# Patient Record
Sex: Male | Born: 1940 | Race: White | Hispanic: No | Marital: Married | Smoking: Never smoker
Health system: Southern US, Community
[De-identification: ages and names within clinical notes are randomized; demographics above are authoritative.]

## PROBLEM LIST (undated history)

## (undated) DIAGNOSIS — H269 Unspecified cataract: Secondary | ICD-10-CM

## (undated) DIAGNOSIS — K5792 Diverticulitis of intestine, part unspecified, without perforation or abscess without bleeding: Secondary | ICD-10-CM

## (undated) DIAGNOSIS — I82409 Acute embolism and thrombosis of unspecified deep veins of unspecified lower extremity: Secondary | ICD-10-CM

## (undated) DIAGNOSIS — I10 Essential (primary) hypertension: Secondary | ICD-10-CM

## (undated) DIAGNOSIS — T884XXA Failed or difficult intubation, initial encounter: Secondary | ICD-10-CM

## (undated) DIAGNOSIS — T8859XA Other complications of anesthesia, initial encounter: Secondary | ICD-10-CM

## (undated) DIAGNOSIS — Z87442 Personal history of urinary calculi: Secondary | ICD-10-CM

## (undated) DIAGNOSIS — Z95828 Presence of other vascular implants and grafts: Secondary | ICD-10-CM

## (undated) DIAGNOSIS — K5790 Diverticulosis of intestine, part unspecified, without perforation or abscess without bleeding: Secondary | ICD-10-CM

## (undated) HISTORY — DX: Diverticulitis of intestine, part unspecified, without perforation or abscess without bleeding: K57.92

## (undated) HISTORY — DX: Diverticulosis of intestine, part unspecified, without perforation or abscess without bleeding: K57.90

## (undated) HISTORY — DX: Essential (primary) hypertension: I10

## (undated) HISTORY — DX: Presence of other vascular implants and grafts: Z95.828

## (undated) HISTORY — PX: COLONOSCOPY: SHX174

## (undated) HISTORY — DX: Unspecified cataract: H26.9

## (undated) HISTORY — DX: Acute embolism and thrombosis of unspecified deep veins of unspecified lower extremity: I82.409

## (undated) HISTORY — PX: CATARACT EXTRACTION: SUR2

## (undated) HISTORY — PX: POLYPECTOMY: SHX149

---

## 1983-11-22 HISTORY — PX: HERNIA REPAIR: SHX51

## 1999-11-22 HISTORY — PX: KNEE SURGERY: SHX244

## 2003-11-22 HISTORY — PX: CHOLECYSTECTOMY: SHX55

## 2004-06-21 DIAGNOSIS — Z95828 Presence of other vascular implants and grafts: Secondary | ICD-10-CM

## 2004-06-21 HISTORY — DX: Presence of other vascular implants and grafts: Z95.828

## 2015-04-06 ENCOUNTER — Encounter: Payer: Self-pay | Admitting: Gastroenterology

## 2015-04-10 ENCOUNTER — Encounter: Payer: Self-pay | Admitting: Gastroenterology

## 2015-04-10 ENCOUNTER — Ambulatory Visit (INDEPENDENT_AMBULATORY_CARE_PROVIDER_SITE_OTHER): Payer: Medicare Other | Admitting: Gastroenterology

## 2015-04-10 ENCOUNTER — Other Ambulatory Visit (INDEPENDENT_AMBULATORY_CARE_PROVIDER_SITE_OTHER): Payer: Medicare Other

## 2015-04-10 VITALS — BP 140/74 | HR 66 | Ht 68.75 in | Wt 272.0 lb

## 2015-04-10 DIAGNOSIS — R109 Unspecified abdominal pain: Secondary | ICD-10-CM | POA: Diagnosis not present

## 2015-04-10 LAB — CBC WITH DIFFERENTIAL/PLATELET
Basophils Absolute: 0.1 10*3/uL (ref 0.0–0.1)
Basophils Relative: 0.8 % (ref 0.0–3.0)
Eosinophils Absolute: 0.3 10*3/uL (ref 0.0–0.7)
Eosinophils Relative: 4.2 % (ref 0.0–5.0)
HCT: 39.2 % (ref 39.0–52.0)
Hemoglobin: 13.4 g/dL (ref 13.0–17.0)
Lymphocytes Relative: 21.8 % (ref 12.0–46.0)
Lymphs Abs: 1.6 10*3/uL (ref 0.7–4.0)
MCHC: 34.3 g/dL (ref 30.0–36.0)
MCV: 95.9 fl (ref 78.0–100.0)
Monocytes Absolute: 0.7 10*3/uL (ref 0.1–1.0)
Monocytes Relative: 9.4 % (ref 3.0–12.0)
Neutro Abs: 4.7 10*3/uL (ref 1.4–7.7)
Neutrophils Relative %: 63.8 % (ref 43.0–77.0)
Platelets: 165 10*3/uL (ref 150.0–400.0)
RBC: 4.09 Mil/uL — ABNORMAL LOW (ref 4.22–5.81)
RDW: 13.8 % (ref 11.5–15.5)
WBC: 7.4 10*3/uL (ref 4.0–10.5)

## 2015-04-10 LAB — PROTIME-INR
INR: 2 ratio — ABNORMAL HIGH (ref 0.8–1.0)
Prothrombin Time: 21.9 s — ABNORMAL HIGH (ref 9.6–13.1)

## 2015-04-10 LAB — COMPREHENSIVE METABOLIC PANEL
ALT: 26 U/L (ref 0–53)
AST: 43 U/L — ABNORMAL HIGH (ref 0–37)
Albumin: 4.2 g/dL (ref 3.5–5.2)
Alkaline Phosphatase: 62 U/L (ref 39–117)
BUN: 13 mg/dL (ref 6–23)
CO2: 32 mEq/L (ref 19–32)
Calcium: 9.4 mg/dL (ref 8.4–10.5)
Chloride: 105 mEq/L (ref 96–112)
Creatinine, Ser: 1.01 mg/dL (ref 0.40–1.50)
GFR: 76.79 mL/min (ref >60.00)
Glucose, Bld: 124 mg/dL — ABNORMAL HIGH (ref 70–99)
Potassium: 4.2 mEq/L (ref 3.5–5.1)
Sodium: 141 mEq/L (ref 135–145)
Total Bilirubin: 0.6 mg/dL (ref 0.2–1.2)
Total Protein: 7.3 g/dL (ref 6.0–8.3)

## 2015-04-10 MED ORDER — CIPROFLOXACIN HCL 500 MG PO TABS: 500.0000 mg | ORAL_TABLET | Freq: Two times a day (BID) | ORAL | Status: DC

## 2015-04-10 NOTE — Progress Notes (Signed)
HPI: This is a  very pleasant 74 year old man    who was referred to me by Dr. Lars Pinks to evaluate  abdominal pain .    Chief complaint is left-sided abdominal pain  Colonoscopy 2008 Dr. West Carbo, no polyps, but + diverticulosis.    He had lower abd pains 2010, had CT scan, which showed "extensive diverticulitis" per patient.  Took cipro/flagyl.  Had another bout 2012, with abd pains, presumed diverticulitits, treated with abx empiricially  Has been treated with cipro/flagyl for 10 days and symptoms have always improved.  Pains interimittently in LLQ pain for past month, no fevers or chills. Mild nausea.  Just an ache which it hits.  His bowels have been a bit looser than usual, a bit off color as well (yellow).  He restarted cipro/flagyl 4 days ago, not as much improvement.  Overall stable weight.    Review of systems: Pertinent positive and negative review of systems were noted in the above HPI section. Complete review of systems was performed and was otherwise normal.   Past Medical History  Diagnosis Date  . Diverticulosis   . Diverticulitis   . DVT (deep venous thrombosis)   . Hypertension   . Kidney stones     Past Surgical History  Procedure Laterality Date  . Cholecystectomy  2005  . Hernia repair  1985  . Knee surgery  2001    Current Outpatient Prescriptions  Medication Sig Dispense Refill  . allopurinol (ZYLOPRIM) 300 MG tablet Take 300 mg by mouth daily.    . ciprofloxacin (CIPRO) 500 MG tablet Take 500 mg by mouth 2 (two) times daily.    Marland Kitchen labetalol (NORMODYNE) 200 MG tablet Take 200 mg by mouth 2 (two) times daily.    . metroNIDAZOLE (FLAGYL) 500 MG tablet Take 500 mg by mouth 3 (three) times daily.    Marland Kitchen warfarin (COUMADIN) 5 MG tablet Take 5 mg by mouth daily. 1 1/2 tablet daily alternating with 1 tablet daily     No current facility-administered medications for this visit.    Allergies as of 04/10/2015  . (No Known Allergies)    Family History   Problem Relation Age of Onset  . Stomach cancer      History   Social History  . Marital Status: Married    Spouse Name: N/A  . Number of Children: 2  . Years of Education: N/A   Occupational History  . Retired    Social History Main Topics  . Smoking status: Never Smoker   . Smokeless tobacco: Never Used  . Alcohol Use: No  . Drug Use: No  . Sexual Activity: Not on file   Other Topics Concern  . Not on file   Social History Narrative  . No narrative on file     Physical Exam: BP 146/78 mmHg  Pulse 66  Ht 5' 8.75" (1.746 m)  Wt 272 lb (123.378 kg)  BMI 40.47 kg/m2 Constitutional: generally well-appearing, obese Psychiatric: alert and oriented x3 Eyes: extraocular movements intact Mouth: oral pharynx moist, no lesions Neck: supple no lymphadenopathy Cardiovascular: heart regular rate and rhythm Lungs: clear to auscultation bilaterally Abdomen: soft, mildly tender left lower quadrant, nondistended, no obvious ascites, no peritoneal signs, normal bowel sounds Extremities: no lower extremity edema bilaterally Skin: no lesions on visible extremities   Assessment and plan: 74 y.o. male with  history of diverticulitis, now with left-sided abdominal pains  Seems most likely that this is recurrent diverticulitis. I would like to get records  from his previous work up in [place] including colonoscopy and CAT scans. He will have a set of blood work today including a CBC, complete metabolic profile and coags done today. I recommended he continue his Cipro and Flagyl for a total of 10 days. We will arrange for repeat CT scan abdomen and pelvis with IV and oral contrast.   [name], MD [facility] 04/10/2015, 2:35 PM  Cc: Lars Pinks, MD

## 2015-04-10 NOTE — Patient Instructions (Addendum)
We will get records sent from your previous gastroenterologist for review.  This will include any endoscopic (colonoscopy or upper endoscopy) procedures and any associated pathology reports.  Also want CT scan results from 2008. Will call in refills of cipro (570m pill, one pill twice daily). Continue cipro and flagyl for total 10 days course. You will have labs checked today in the basement lab.  Please head down after you check out with the front desk  (cbc, cmet, inr). You will be set up for a CT scan of abdomen and pelvis with IV and oral contrast. You have been scheduled [id number]---this is in the same building as [facility].   You are scheduled on 04/17/15 at 10 am. You should arrive 15 minutes prior to your appointment time for registration. Please follow the written instructions below on the day of your exam:  WARNING: IF YOU ARE ALLERGIC TO IODINE/X-RAY DYE, PLEASE NOTIFY RADIOLOGY IMMEDIATELY AT [phone] YOU WILL BE GIVEN A 13 HOUR PREMEDICATION PREP.  1) Do not eat or drink anything after 6 am (4 hours prior to your test) 2) You have been given 2 bottles of oral contrast to drink. The solution may taste better if refrigerated, but do NOT add ice or any other liquid to this solution. Shake well before drinking.    Drink 1 bottle of contrast @ 8 am (2 hours prior to your exam)  Drink 1 bottle of contrast @ 9 am (1 hour prior to your exam)  You may take any medications as prescribed with a small amount of water except for the following: Metformin, Glucophage, Glucovance, Avandamet, Riomet, Fortamet, Actoplus Met, Janumet, Glumetza or Metaglip. The above medications must be held the day of the exam AND 48 hours after the exam.  The purpose of you drinking the oral contrast is to aid in the visualization of your intestinal tract. The contrast solution may cause some diarrhea. Before your exam is started,  you will be given a small amount of fluid to drink. Depending on your individual set of symptoms, you may also receive an intravenous injection of x-ray contrast/dye. [facility], depending on the type of exam you are having performed.  This test typically takes 30-45 minutes to complete.  If you have any questions regarding your exam or if you need to reschedule, you may call the CT department at [phone] the hours of 8:00 am and 5:00 pm, Monday-Friday.  ________________________________________________________________________

## 2015-04-13 ENCOUNTER — Telehealth: Payer: Self-pay | Admitting: Gastroenterology

## 2015-04-14 NOTE — Telephone Encounter (Signed)
Please call the patient. WBC was normal (less likely diverticulitis). Still awaiting records from previous GI. Await upcoming CT scan report as well. Should continue with the suggestions outlined at recent visit.

## 2015-04-14 NOTE — Telephone Encounter (Signed)
Pt aware we will call when the CT results are back.

## 2015-04-17 ENCOUNTER — Ambulatory Visit (INDEPENDENT_AMBULATORY_CARE_PROVIDER_SITE_OTHER)
Admission: RE | Admit: 2015-04-17 | Discharge: 2015-04-17 | Disposition: A | Discharge status: Home / Self Care | Payer: Medicare Other | Source: Ambulatory Visit | Attending: Gastroenterology | Admitting: Gastroenterology

## 2015-04-17 ENCOUNTER — Telehealth: Payer: Self-pay | Admitting: Gastroenterology

## 2015-04-17 DIAGNOSIS — R109 Unspecified abdominal pain: Secondary | ICD-10-CM

## 2015-04-17 MED ORDER — IOHEXOL 300 MG/ML  SOLN
100.0000 mL | Freq: Once | INTRAMUSCULAR | Status: AC | PRN
  Administered 2015-04-17: 100 mL via INTRAVENOUS

## 2015-04-17 NOTE — Telephone Encounter (Signed)
[place] [facility] [place] [facility]: "extensive diverticulosis, no evidence of polyps" also "less than ideal prep"  Awaiting CT scan report

## 2015-04-21 ENCOUNTER — Telehealth: Payer: Self-pay | Admitting: Gastroenterology

## 2015-04-22 NOTE — Telephone Encounter (Signed)
Please call the patient. Scan shows diverticulosis, ? Mild residual diverticulitis as well. ALSO scan shows cirrhosis. This is new information for him. The liver appears scarred, but by all lab tests his liver is functioning very well. His last colonoscopy was 9 years ago and the prep was "less than idea" and so I think that should be repeated in 3-4 weeks from now. He is on coumadin for DVT, needs to be held for 5 days (need OK from PCP) about that.  Thanks

## 2015-04-24 NOTE — Telephone Encounter (Signed)
Pt has been scheduled for 06/01/15 for colon and 05/27/15 for previsit and coumadin clearance letter has been sent to Dr Lars Pinks.

## 2015-05-07 ENCOUNTER — Telehealth: Payer: Self-pay | Admitting: Gastroenterology

## 2015-05-07 NOTE — Telephone Encounter (Signed)
I would not repeat Abx for now, he is already scheduled for colonsocoyp in early/mid July and should keep that appt. If pains or diarhea significantly worsens he needs to call before then.  thanks

## 2015-05-07 NOTE — Telephone Encounter (Signed)
Pt notified of the recommendations and will keep colon as scheduled.  He will call if needed prior to that appt

## 2015-05-07 NOTE — Telephone Encounter (Signed)
Pt finished the cipro and flagyl course. He still has loose stools and some discomfort.  He would like to know if he should take another course of abx or wait and see how he does?

## 2015-05-26 ENCOUNTER — Telehealth: Payer: Self-pay

## 2015-05-26 NOTE — Telephone Encounter (Signed)
-----   Message from Barron Alvine, [place] sent at 04/24/2015 11:01 AM EDT ----- Waiting for anti coag from Dr Lars Pinks for coumadin clearance

## 2015-05-27 ENCOUNTER — Telehealth: Payer: Self-pay

## 2015-05-27 ENCOUNTER — Ambulatory Visit (AMBULATORY_SURGERY_CENTER): Payer: Self-pay

## 2015-05-27 VITALS — Ht 69.0 in | Wt 264.4 lb

## 2015-05-27 DIAGNOSIS — R109 Unspecified abdominal pain: Secondary | ICD-10-CM

## 2015-05-27 MED ORDER — MOVIPREP 100 G PO SOLR: 1.0000 | Freq: Once | ORAL | Status: DC

## 2015-05-27 NOTE — Telephone Encounter (Signed)
Still no response from V Falgui concerning coumadin.  Pt is scheduled to be in previsit at 8:30am this am.  Are you OK with [place] going ahead and holding Coumadin for 5 days?  Previsit, Levada Dy

## 2015-05-27 NOTE — Telephone Encounter (Signed)
No, I prefer input from his PCP about the coumadin.    Patty, can you please try his PCP office again.  If we don't hear from their office today we will have to postpone his procedure.

## 2015-05-27 NOTE — Telephone Encounter (Signed)
See alternate note  

## 2015-05-27 NOTE — Telephone Encounter (Signed)
Ok, thanks.

## 2015-05-27 NOTE — Telephone Encounter (Signed)
Has been told has a "kink" in airway by anesthesiologist but has been intubated since then while awake.  Has had previsit on 05/27/15.  Still OK for [facility]?  Levada Dy

## 2015-05-27 NOTE — Telephone Encounter (Signed)
Pt says he was called by V Falgui's office.  He hasnt had Coumadin since Monday 05/25/15.  He was told to stop for one week before procedure.  Thank you, Levada Dy

## 2015-05-27 NOTE — Progress Notes (Signed)
Has a "kink" in airway (was told by anesthesiologist but has been intubated since then)  No allergies to eggs or soy No home oxygen No diet/weight loss meds  No computer

## 2015-05-28 ENCOUNTER — Telehealth: Payer: Self-pay | Admitting: Gastroenterology

## 2015-05-28 NOTE — Telephone Encounter (Signed)
Austin Guzman,  He needs to be scheduled at the hospital  Thanks,  Jenny Reichmann

## 2015-05-28 NOTE — Telephone Encounter (Signed)
Pharmacy was called and the Movi prep is $110 pt has been notified

## 2015-05-29 ENCOUNTER — Telehealth: Payer: Self-pay | Admitting: Gastroenterology

## 2015-05-29 ENCOUNTER — Other Ambulatory Visit: Payer: Self-pay

## 2015-05-29 DIAGNOSIS — K5731 Diverticulosis of large intestine without perforation or abscess with bleeding: Secondary | ICD-10-CM

## 2015-05-29 NOTE — Telephone Encounter (Signed)
Dr Jacobs please advise  

## 2015-05-29 NOTE — Telephone Encounter (Signed)
Per Osvaldo Angst CRNA, pt needs to have procedure at hospital for airway issues (see previous note) pt was scheduled for colonoscopy at [facility] on Monday 06/01/15 at 2:30 pm for diverticulosis-thanks adm  Patty- pt is on coumadin, this looks like it might need to be readdressed.

## 2015-05-29 NOTE — Telephone Encounter (Signed)
Pt called to confirm colon instructions

## 2015-05-29 NOTE — Telephone Encounter (Signed)
Ok, need to change this to being done at [facility] with MAC sedation, next Thursday (14th).  Please cancel the [place] case.   Thanks

## 2015-05-29 NOTE — Telephone Encounter (Signed)
Sorry I closed this by accident

## 2015-05-29 NOTE — Telephone Encounter (Signed)
Pt has been re scheduled to [facility] 06/04/15 130 pm, he was re instructed and will call with any further questions or concerns.  He was advised to remain off of coumadin per Dr Ardis Hughs

## 2015-05-29 NOTE — Telephone Encounter (Signed)
Per Dr Ardis Hughs pt can remain off of plavix until after the procedure

## 2015-06-01 ENCOUNTER — Encounter: Payer: Medicare Other | Admitting: Gastroenterology

## 2015-06-01 ENCOUNTER — Encounter (HOSPITAL_COMMUNITY): Payer: Self-pay | Admitting: *Deleted

## 2015-06-03 ENCOUNTER — Ambulatory Visit: Payer: Self-pay | Admitting: Gastroenterology

## 2015-06-04 ENCOUNTER — Ambulatory Visit (HOSPITAL_COMMUNITY)
Admission: RE | Admit: 2015-06-04 | Discharge: 2015-06-04 | Disposition: A | Discharge status: Home / Self Care | Payer: Medicare Other | Source: Ambulatory Visit | Attending: Gastroenterology | Admitting: Gastroenterology

## 2015-06-04 ENCOUNTER — Encounter (HOSPITAL_COMMUNITY)
Admission: RE | Disposition: A | Discharge status: Home / Self Care | Payer: Self-pay | Source: Ambulatory Visit | Attending: Gastroenterology

## 2015-06-04 DIAGNOSIS — I10 Essential (primary) hypertension: Secondary | ICD-10-CM | POA: Insufficient documentation

## 2015-06-04 DIAGNOSIS — R972 Elevated prostate specific antigen [PSA]: Secondary | ICD-10-CM | POA: Insufficient documentation

## 2015-06-04 DIAGNOSIS — Z7901 Long term (current) use of anticoagulants: Secondary | ICD-10-CM | POA: Diagnosis not present

## 2015-06-04 DIAGNOSIS — Z538 Procedure and treatment not carried out for other reasons: Secondary | ICD-10-CM | POA: Diagnosis not present

## 2015-06-04 DIAGNOSIS — Z1211 Encounter for screening for malignant neoplasm of colon: Secondary | ICD-10-CM | POA: Insufficient documentation

## 2015-06-04 DIAGNOSIS — Z79899 Other long term (current) drug therapy: Secondary | ICD-10-CM | POA: Diagnosis not present

## 2015-06-04 DIAGNOSIS — E785 Hyperlipidemia, unspecified: Secondary | ICD-10-CM | POA: Insufficient documentation

## 2015-06-04 DIAGNOSIS — Z86718 Personal history of other venous thrombosis and embolism: Secondary | ICD-10-CM | POA: Insufficient documentation

## 2015-06-04 DIAGNOSIS — M109 Gout, unspecified: Secondary | ICD-10-CM | POA: Insufficient documentation

## 2015-06-04 HISTORY — DX: Personal history of urinary calculi: Z87.442

## 2015-06-04 SURGERY — CANCELLED PROCEDURE

## 2015-06-04 NOTE — H&P (Signed)
  He dranak first 1/2 of Movi prep last night without trouble. Early this AM he awoke unable to manage his secretions, full feeling in throat.  He came to [facility] admitting and I met him in endo. On exam his tongue was too large to get a good view of his throat but his voice was gurgly.  He is breathing fine.    I think he is probably having a reaction to the prep, this is quite unusual however. Case cancelled for today and I called his ENT office and they were very helpful, able to see him this morning on urgent basis.  We will contact him about rescheduling the procedure, probably with non PEG prep.  I did quick review, PEG preps rarely cause anaphylaxis.

## 2015-06-04 NOTE — Progress Notes (Signed)
Pt showed up early for colonoscopy today because he woke up and his throat was "almost swollen shut," when he woke up this morning. Pt states that he can still swallow but it is difficult, he is not having any difficulty breathing and does not present with a rash or fever. Pt denies pain in his throat, just swelling. Dr. Ardis Hughs to evaluate. Brt, rn

## 2015-08-10 ENCOUNTER — Telehealth: Payer: Self-pay | Admitting: Gastroenterology

## 2015-08-10 NOTE — Telephone Encounter (Signed)
Spoke with the patient. He is having some off and on pain to the left of his abdomen. No changes in his bowel movements, but he is having increased gas. No blood in bowel movements. He states he has not had diverticulitis since 2011, but he "remembers how it felt." His symptoms have been coming on for a week. There are no openings on the APP schedule this week. Please advise.

## 2015-08-11 ENCOUNTER — Other Ambulatory Visit (INDEPENDENT_AMBULATORY_CARE_PROVIDER_SITE_OTHER): Payer: Medicare Other

## 2015-08-11 ENCOUNTER — Other Ambulatory Visit: Payer: Self-pay

## 2015-08-11 DIAGNOSIS — R1032 Left lower quadrant pain: Secondary | ICD-10-CM

## 2015-08-11 LAB — CBC WITH DIFFERENTIAL/PLATELET
Basophils Absolute: 0.1 10*3/uL (ref 0.0–0.1)
Basophils Relative: 0.7 % (ref 0.0–3.0)
Eosinophils Absolute: 0.3 10*3/uL (ref 0.0–0.7)
Eosinophils Relative: 3.1 % (ref 0.0–5.0)
HCT: 42.2 % (ref 39.0–52.0)
Hemoglobin: 14.1 g/dL (ref 13.0–17.0)
Lymphocytes Relative: 22.9 % (ref 12.0–46.0)
Lymphs Abs: 2.3 10*3/uL (ref 0.7–4.0)
MCHC: 33.5 g/dL (ref 30.0–36.0)
MCV: 98.5 fl (ref 78.0–100.0)
Monocytes Absolute: 1.2 10*3/uL — ABNORMAL HIGH (ref 0.1–1.0)
Monocytes Relative: 11.4 % (ref 3.0–12.0)
Neutro Abs: 6.3 10*3/uL (ref 1.4–7.7)
Neutrophils Relative %: 61.9 % (ref 43.0–77.0)
Platelets: 167 10*3/uL (ref 150.0–400.0)
RBC: 4.28 Mil/uL (ref 4.22–5.81)
RDW: 14.3 % (ref 11.5–15.5)
WBC: 10.2 10*3/uL (ref 4.0–10.5)

## 2015-08-11 LAB — COMPREHENSIVE METABOLIC PANEL
ALT: 25 U/L (ref 0–53)
AST: 30 U/L (ref 0–37)
Albumin: 4.2 g/dL (ref 3.5–5.2)
Alkaline Phosphatase: 65 U/L (ref 39–117)
BUN: 15 mg/dL (ref 6–23)
CO2: 29 mEq/L (ref 19–32)
Calcium: 9.6 mg/dL (ref 8.4–10.5)
Chloride: 104 mEq/L (ref 96–112)
Creatinine, Ser: 0.96 mg/dL (ref 0.40–1.50)
GFR: 81.35 mL/min (ref >60.00)
Glucose, Bld: 105 mg/dL — ABNORMAL HIGH (ref 70–99)
Potassium: 4.3 mEq/L (ref 3.5–5.1)
Sodium: 141 mEq/L (ref 135–145)
Total Bilirubin: 0.7 mg/dL (ref 0.2–1.2)
Total Protein: 7.6 g/dL (ref 6.0–8.3)

## 2015-08-11 NOTE — Telephone Encounter (Signed)
Patient in for labs today. Appointment here 08/14/15. If he acutely worsens he will call or go to the ER

## 2015-08-11 NOTE — Telephone Encounter (Signed)
Looks like there is a 2:30 opening this Friday on my schedule. Please off him that appt.  Would like him to have CBC, cmet the day prior as well.  Thanks

## 2015-08-11 NOTE — Telephone Encounter (Signed)
You have 8 patients on Friday afternoon. Is it okay to add him on? The 2:30 pm has been taken. I will get him in for labs.

## 2015-08-11 NOTE — Telephone Encounter (Signed)
Darn that was taken quickly.  Yes OK to add him on double book for the morning.  thanks

## 2015-08-12 ENCOUNTER — Telehealth: Payer: Self-pay | Admitting: Gastroenterology

## 2015-08-12 NOTE — Telephone Encounter (Signed)
I am sorry, it has been filled.

## 2015-08-12 NOTE — Telephone Encounter (Signed)
Looks like I have an opening at 1:30 now on that same Friday. Can you please move him to that spot.

## 2015-08-14 ENCOUNTER — Telehealth: Payer: Self-pay | Admitting: *Deleted

## 2015-08-14 ENCOUNTER — Ambulatory Visit (INDEPENDENT_AMBULATORY_CARE_PROVIDER_SITE_OTHER): Payer: Medicare Other | Admitting: Gastroenterology

## 2015-08-14 ENCOUNTER — Encounter: Payer: Self-pay | Admitting: Gastroenterology

## 2015-08-14 VITALS — BP 132/80 | HR 76 | Ht 70.0 in | Wt 261.0 lb

## 2015-08-14 DIAGNOSIS — R109 Unspecified abdominal pain: Secondary | ICD-10-CM

## 2015-08-14 NOTE — Progress Notes (Signed)
[facility] [facility] [place] [facility] [facility]: "extensive diverticulosis, no evidence of polyps" also "less than ideal prep"  CT scan abd/pelvis with iv and oral contrast 03/2015 1. Severe diverticulosis involving the descending colon and sigmoid colon. Suspect resolving diverticulitis involving the upper sigmoid colon with some residual wall thickening and mild inflammation. No diverticular abscess or free air. 2. CT findings consistent with cirrhosis as detailed above. No ascites. 3. Renal scarring changes, renal cysts and left renal calculus. 4. Status post cholecystectomy with mild intra and extrahepatic biliary dilatation.\\  I last saw him about 2 months ago at the time of a colonoscopy July 2016. He was having an allergic type reaction to his bowel prep. He was having upper throat swelling. The procedure was canceled and he was seen that day by his ear nose and throat physician.    He called earlier this week with LLQ pain; cbc, cmet were both normal and he was set for this appt.   HPI: This is a    very pleasant 74 year old man whom I last saw 2 months ago. He is here with his wife today.  Chief complaint is left lower quadrant pain He continues to have mild left lower quadrant pain. No fevers or chills. The pain is an ache. Does not seem to be too severe. No associated bowel changes. He does not see blood in his stool. He had CBC and see met earlier this week and they're both normal.    Past Medical History  Diagnosis Date  . Diverticulosis   . Diverticulitis   . DVT (deep venous thrombosis)     "green field filter" right thigh   . Hypertension   . History of kidney stones     "lithotripsies"    Past Surgical History  Procedure Laterality Date  . Cholecystectomy  2005  . Knee surgery  2001    left knee scope  . Colonoscopy    . Hernia repair  1985    inguinal  . Cataract extraction Right     Current Outpatient Prescriptions  Medication Sig Dispense Refill  . allopurinol  (ZYLOPRIM) 300 MG tablet Take 300 mg by mouth daily.    Marland Kitchen labetalol (NORMODYNE) 200 MG tablet Take 200 mg by mouth 2 (two) times daily.    Marland Kitchen warfarin (COUMADIN) 5 MG tablet Take 5-7.5 mg by mouth daily. 1 1/2 tablet daily alternating with 1 tablet daily     No current facility-administered medications for this visit.    Allergies as of 08/14/2015 - Review Complete 08/14/2015  Allergen Reaction Noted  . Demerol [meperidine] Nausea And Vomiting 05/27/2015    Family History  Problem Relation Age of Onset  . Stomach cancer    . Colon cancer Neg Hx   . Colon polyps Neg Hx     Social History   Social History  . Marital Status: Married    Spouse Name: N/A  . Number of Children: 2  . Years of Education: N/A   Occupational History  . Retired    Social History Main Topics  . Smoking status: Never Smoker   . Smokeless tobacco: Never Used  . Alcohol Use: No  . Drug Use: No  . Sexual Activity: Not on file   Other Topics Concern  . Not on file   Social History Narrative     Physical Exam: BP 132/80 mmHg  Pulse 76  Ht _0  (1.778 m)  Wt 261 lb (118.389 kg)  BMI 37.45 kg/m2 Constitutional: generally well-appearing Psychiatric:  alert and oriented x3 Abdomen: soft, nontender, nondistended, no obvious ascites, no peritoneal signs, normal bowel sounds   Assessment and plan: 74 y.o. male with left lower quadrant pain, abnormal sigmoid on May 2016 CT, allergic type reaction to MoviPrep   I do not think he currently has acute diverticulitis. He has had abnormal sigmoid colon in his last colonoscopy was 9 years ago and the prep was described as "less than perfect". I recommended that we try to repeat prep him for colonoscopy. He really seemed to have an allergic reaction to MoviPrep and so this time I will go with a bottle of mag citrate with Fleet enemas on either side. We will do this form across the street because he has had tracheal problems in the past. He will hold his  Coumadin 5 days as he was authorized to do before by his primary care physician.   [name], MD [facility] 08/14/2015, 2:46 PM

## 2015-08-14 NOTE — Patient Instructions (Addendum)
You will be set up for a colonoscopy at [facility] with MAC sedation for left sided abdominal pains, abnormal CT scan. Prep will be 2 days of clears, 1 fleet enema the night before the colonoscopy followed by 1 bottle of mag citrate.  Early the morning of the colonoscopy you will complete another fleet enema. You need to hold your coumadin for 5 days prior to the colonoscopy (Dr. Lars Pinks agreed to holding your coumadin previously).

## 2015-08-14 NOTE — Telephone Encounter (Signed)
Letter faxed over    08/14/2015   RE: Quinton Voth DOB: 23-Nov-1940 MRN: [id number]   Dear Dr. Lars Pinks,    We have scheduled the above patient for an endoscopic procedure. Our records show that he is on anticoagulation therapy.   Please advise as to how long the patient may come off his therapy of coumadin prior to the procedure, which is scheduled for 08-20-15.  Please fax back/ or route the completed form to casie at [phone].   Sincerely,    Gerlean Ren

## 2015-08-17 ENCOUNTER — Encounter (HOSPITAL_COMMUNITY): Payer: Self-pay | Admitting: *Deleted

## 2015-08-17 NOTE — Telephone Encounter (Signed)
Dr. Lars Pinks office faxed over clearance letter ok to hold coumadin. Spoke to patient and he already started holding his coumadin on Saturday. Clearance letter scanned into EPIC.

## 2015-08-17 NOTE — Telephone Encounter (Signed)
Called over to Dr. Lars Pinks office and nurse to call back.

## 2015-08-20 ENCOUNTER — Ambulatory Visit (HOSPITAL_COMMUNITY): Payer: Medicare Other | Admitting: Anesthesiology

## 2015-08-20 ENCOUNTER — Ambulatory Visit (HOSPITAL_COMMUNITY)
Admission: RE | Admit: 2015-08-20 | Discharge: 2015-08-20 | Disposition: A | Discharge status: Home / Self Care | Payer: Medicare Other | Source: Ambulatory Visit | Attending: Gastroenterology | Admitting: Gastroenterology

## 2015-08-20 ENCOUNTER — Telehealth: Payer: Self-pay

## 2015-08-20 ENCOUNTER — Encounter (HOSPITAL_COMMUNITY)
Admission: RE | Disposition: A | Discharge status: Home / Self Care | Payer: Self-pay | Source: Ambulatory Visit | Attending: Gastroenterology

## 2015-08-20 ENCOUNTER — Encounter (HOSPITAL_COMMUNITY): Payer: Self-pay | Admitting: Anesthesiology

## 2015-08-20 DIAGNOSIS — Z6837 Body mass index (BMI) 37.0-37.9, adult: Secondary | ICD-10-CM | POA: Insufficient documentation

## 2015-08-20 DIAGNOSIS — Z9049 Acquired absence of other specified parts of digestive tract: Secondary | ICD-10-CM | POA: Insufficient documentation

## 2015-08-20 DIAGNOSIS — E669 Obesity, unspecified: Secondary | ICD-10-CM | POA: Insufficient documentation

## 2015-08-20 DIAGNOSIS — R109 Unspecified abdominal pain: Secondary | ICD-10-CM | POA: Diagnosis not present

## 2015-08-20 DIAGNOSIS — I10 Essential (primary) hypertension: Secondary | ICD-10-CM | POA: Diagnosis not present

## 2015-08-20 DIAGNOSIS — R933 Abnormal findings on diagnostic imaging of other parts of digestive tract: Secondary | ICD-10-CM

## 2015-08-20 DIAGNOSIS — Z7901 Long term (current) use of anticoagulants: Secondary | ICD-10-CM | POA: Insufficient documentation

## 2015-08-20 DIAGNOSIS — D123 Benign neoplasm of transverse colon: Secondary | ICD-10-CM

## 2015-08-20 DIAGNOSIS — Z86718 Personal history of other venous thrombosis and embolism: Secondary | ICD-10-CM | POA: Diagnosis not present

## 2015-08-20 DIAGNOSIS — D122 Benign neoplasm of ascending colon: Secondary | ICD-10-CM | POA: Insufficient documentation

## 2015-08-20 DIAGNOSIS — R1032 Left lower quadrant pain: Secondary | ICD-10-CM | POA: Insufficient documentation

## 2015-08-20 DIAGNOSIS — K5730 Diverticulosis of large intestine without perforation or abscess without bleeding: Secondary | ICD-10-CM | POA: Diagnosis not present

## 2015-08-20 DIAGNOSIS — Z79899 Other long term (current) drug therapy: Secondary | ICD-10-CM | POA: Insufficient documentation

## 2015-08-20 HISTORY — PX: COLONOSCOPY: SHX5424

## 2015-08-20 SURGERY — COLONOSCOPY
Anesthesia: Monitor Anesthesia Care

## 2015-08-20 MED ORDER — LACTATED RINGERS IV SOLN
INTRAVENOUS | Status: DC | PRN
  Administered 2015-08-20: 07:00:00 via INTRAVENOUS

## 2015-08-20 MED ORDER — SODIUM CHLORIDE 0.9 % IV SOLN
INTRAVENOUS | Status: DC
Start: 2015-08-20 — End: 2015-08-20

## 2015-08-20 MED ORDER — PROPOFOL 500 MG/50ML IV EMUL
INTRAVENOUS | Status: DC | PRN
  Administered 2015-08-20: 75 ug/kg/min via INTRAVENOUS

## 2015-08-20 MED ORDER — PROPOFOL 10 MG/ML IV BOLUS
INTRAVENOUS | Status: AC
  Filled 2015-08-20: qty 20

## 2015-08-20 MED ORDER — PROPOFOL 10 MG/ML IV BOLUS
INTRAVENOUS | Status: DC | PRN
  Administered 2015-08-20 (×2): 20 mg via INTRAVENOUS
  Administered 2015-08-20 (×2): 10 mg via INTRAVENOUS
  Administered 2015-08-20: 40 mg via INTRAVENOUS

## 2015-08-20 NOTE — Anesthesia Postprocedure Evaluation (Signed)
  Anesthesia Post-op Note  Patient: Austin Guzman  Procedure(s) Performed: Procedure(s) (LRB): COLONOSCOPY (N/A)  Patient Location: PACU  Anesthesia Type: MAC  Level of Consciousness: awake and alert   Airway and Oxygen Therapy: Patient Spontanous Breathing  Post-op Pain: mild  Post-op Assessment: Post-op Vital signs reviewed, Patient's Cardiovascular Status Stable, Respiratory Function Stable, Patent Airway and No signs of Nausea or vomiting  Last Vitals:  Filed Vitals:   08/20/15 0830  BP: 115/75  Pulse: 70  Temp:   Resp: 16    Post-op Vital Signs: stable   Complications: No apparent anesthesia complications

## 2015-08-20 NOTE — Discharge Instructions (Signed)

## 2015-08-20 NOTE — Interval H&P Note (Signed)
History and Physical Interval Note:  08/20/2015 7:09 AM  Austin Guzman  has presented today for surgery, with the diagnosis of left side abd pain  The various methods of treatment have been discussed with the patient and family. After consideration of risks, benefits and other options for treatment, the patient has consented to  Procedure(s): COLONOSCOPY (N/A) as a surgical intervention .  The patient's history has been reviewed, patient examined, no change in status, stable for surgery.  I have reviewed the patient's chart and labs.  Questions were answered to the patient's satisfaction.     [name]

## 2015-08-20 NOTE — Telephone Encounter (Signed)
Appt with Dr Kieth Brightly on 08/27/15 1:45 pm  Left message on machine to call back at home and cell number

## 2015-08-20 NOTE — Op Note (Signed)
[facility] [place], [id number]   COLONOSCOPY PROCEDURE REPORT  PATIENT: Austin Guzman, Austin Guzman  MR#: [id number] BIRTHDATE: August 20, 1941 , 74  yrs. old GENDER: male ENDOSCOPIST: [name], MD REFERRED BY: Erick Alley, MD PROCEDURE DATE:  08/20/2015 PROCEDURE:   Colonoscopy with snare polypectomy First Screening Colonoscopy - Avg.  risk and is 50 yrs.  old or older - No.  Prior Negative Screening - Now for repeat screening. N/A  History of Adenoma - Now for follow-up colonoscopy & has been > or = to 3 yrs.  N/A  Polyps removed today? Yes ASA CLASS:   Class III INDICATIONS:abnormal sigmoid on recent CT scan, h/o sigmoid diverticulitis; last colonoscopy in [place] was 9 years ago and the prep was "less than ideal". MEDICATIONS: Monitored anesthesia care  DESCRIPTION OF PROCEDURE:   After the risks benefits and alternatives of the procedure were thoroughly explained, informed consent was obtained.  The digital rectal exam revealed no abnormalities of the rectum.   The Pentax Ped Colon [id number] endoscope was introduced through the anus and advanced to the cecum, which was identified by both the appendix and ileocecal valve. No adverse events experienced.   The quality of the prep was fair.  The instrument was then slowly withdrawn as the colon was fully examined. Estimated blood loss is zero unless otherwise noted in this procedure report.   COLON FINDINGS: There was severe diverticulosis noted throughout the entire examined colon with associated muscular hypertrophy and tortuosity.   Six sessile polyps ranging between 3-42mm in size were found in the transverse colon and ascending colon.  Polypectomies were performed with a cold snare.  The resection was complete, the polyp tissue was completely retrieved and sent to histology. Retroflexed views revealed no abnormalities. The time to cecum = 12.1 Withdrawal time = 14.9   The scope was withdrawn and  the procedure completed. COMPLICATIONS: There were no immediate complications.  ENDOSCOPIC IMPRESSION: 1.   There was severe diverticulosis noted throughout the entire examined colon 2.  Six sessile polyps ranging between 3-60mm in size were found in the transverse colon and ascending colon; polypectomies were performed with a cold snare  RECOMMENDATIONS: Referral to [facility] to consider elective segmental colectomy for chronic, recurrent left sided diverticulitis.  My office will arrange the referral. Follow up colonoscopy in 3 years depending on the final pathology results.  eSigned:  [name], MD 08/20/2015 8:03 AM

## 2015-08-20 NOTE — Transfer of Care (Signed)
Immediate Anesthesia Transfer of Care Note  Patient: Austin Guzman  Procedure(s) Performed: Procedure(s): COLONOSCOPY (N/A)  Patient Location: PACU  Anesthesia Type:MAC  Level of Consciousness:  sedated, patient cooperative and responds to stimulation  Airway & Oxygen Therapy:Patient Spontanous Breathing and Patient connected to face mask oxgen  Post-op Assessment:  Report given to PACU RN and Post -op Vital signs reviewed and stable  Post vital signs:  Reviewed and stable  Last Vitals:  Filed Vitals:   08/20/15 0631  BP: 178/102  Pulse: 66  Temp: 36.7 C  Resp: 12    Complications: No apparent anesthesia complications

## 2015-08-20 NOTE — Telephone Encounter (Signed)
Pt aware of the appt info, she was given the phone number and address of [facility].

## 2015-08-20 NOTE — Telephone Encounter (Signed)
-----   Message from [name], MD sent at 08/20/2015  8:05 AM EDT ----- He needs referral to [facility] to consider elective segmental colectomy for recurrent acute diverticulitits  Thanks

## 2015-08-20 NOTE — Anesthesia Preprocedure Evaluation (Addendum)
Anesthesia Evaluation  Patient identified by MRN, date of birth, ID band Patient awake    Reviewed: Allergy & Precautions, NPO status , Patient's Chart, lab work & pertinent test results, reviewed documented beta blocker date and time   History of Anesthesia Complications Negative for: history of anesthetic complications  Airway Mallampati: III  TM Distance: <3 FB Neck ROM: Full    Dental  (+) Teeth Intact, Dental Advisory Given   Pulmonary neg pulmonary ROS,    Pulmonary exam normal breath sounds clear to auscultation       Cardiovascular hypertension, Pt. on medications and Pt. on home beta blockers (-) angina+ DVT  (-) Past MI negative cardio ROS Normal cardiovascular exam Rhythm:Regular Rate:Normal     Neuro/Psych negative neurological ROS  negative psych ROS   GI/Hepatic negative GI ROS, Neg liver ROS,   Endo/Other  Obesity   Renal/GU negative Renal ROS     Musculoskeletal negative musculoskeletal ROS (+)   Abdominal   Peds  Hematology negative hematology ROS (+)   Anesthesia Other Findings Day of surgery medications reviewed with the patient.  Reproductive/Obstetrics                           Anesthesia Physical Anesthesia Plan  ASA: II  Anesthesia Plan: MAC   Post-op Pain Management:    Induction: Intravenous  Airway Management Planned: Nasal Cannula  Additional Equipment:   Intra-op Plan:   Post-operative Plan:   Informed Consent: I have reviewed the patients History and Physical, chart, labs and discussed the procedure including the risks, benefits and alternatives for the proposed anesthesia with the patient or authorized representative who has indicated his/her understanding and acceptance.   Dental advisory given  Plan Discussed with: CRNA and Anesthesiologist  Anesthesia Plan Comments: (Discussed risks/benefits/alternatives to MAC sedation including need for  ventilatory support, hypotension, need for conversion to general anesthesia.  All patient questions answered.  Patient wished to proceed.)        Anesthesia Quick Evaluation

## 2015-08-20 NOTE — H&P (View-Only) (Signed)
Colonoscopy [facility], 01/2006: "extensive diverticulosis, no evidence of polyps" also "less than ideal prep"  CT scan abd/pelvis with iv and oral contrast 03/2015 1. Severe diverticulosis involving the descending colon and sigmoid colon. Suspect resolving diverticulitis involving the upper sigmoid colon with some residual wall thickening and mild inflammation. No diverticular abscess or free air. 2. CT findings consistent with cirrhosis as detailed above. No ascites. 3. Renal scarring changes, renal cysts and left renal calculus. 4. Status post cholecystectomy with mild intra and extrahepatic biliary dilatation.\\  I last saw him about 2 months ago at the time of a colonoscopy July 2016. He was having an allergic type reaction to his bowel prep. He was having upper throat swelling. The procedure was canceled and he was seen that day by his ear nose and throat physician.    He called earlier this week with LLQ pain; cbc, cmet were both normal and he was set for this appt.   HPI: This is a    very pleasant 74 year old man whom I last saw 2 months ago. He is here with his wife today.  Chief complaint is left lower quadrant pain He continues to have mild left lower quadrant pain. No fevers or chills. The pain is an ache. Does not seem to be too severe. No associated bowel changes. He does not see blood in his stool. He had CBC and see met earlier this week and they're both normal.    Past Medical History  Diagnosis Date  . Diverticulosis   . Diverticulitis   . DVT (deep venous thrombosis)     "green field filter" right thigh   . Hypertension   . History of kidney stones     "lithotripsies"    Past Surgical History  Procedure Laterality Date  . Cholecystectomy  2005  . Knee surgery  2001    left knee scope  . Colonoscopy    . Hernia repair  1985    inguinal  . Cataract extraction Right     Current Outpatient Prescriptions  Medication Sig Dispense Refill  . allopurinol  (ZYLOPRIM) 300 MG tablet Take 300 mg by mouth daily.    Marland Kitchen labetalol (NORMODYNE) 200 MG tablet Take 200 mg by mouth 2 (two) times daily.    Marland Kitchen warfarin (COUMADIN) 5 MG tablet Take 5-7.5 mg by mouth daily. 1 1/2 tablet daily alternating with 1 tablet daily     No current facility-administered medications for this visit.    Allergies as of 08/14/2015 - Review Complete 08/14/2015  Allergen Reaction Noted  . Demerol [meperidine] Nausea And Vomiting 05/27/2015    Family History  Problem Relation Age of Onset  . Stomach cancer    . Colon cancer Neg Hx   . Colon polyps Neg Hx     Social History   Social History  . Marital Status: Married    Spouse Name: N/A  . Number of Children: 2  . Years of Education: N/A   Occupational History  . Retired    Social History Main Topics  . Smoking status: Never Smoker   . Smokeless tobacco: Never Used  . Alcohol Use: No  . Drug Use: No  . Sexual Activity: Not on file   Other Topics Concern  . Not on file   Social History Narrative     Physical Exam: BP 132/80 mmHg  Pulse 76  Ht _0  (1.778 m)  Wt 261 lb (118.389 kg)  BMI 37.45 kg/m2 Constitutional: generally well-appearing Psychiatric:  alert and oriented x3 Abdomen: soft, nontender, nondistended, no obvious ascites, no peritoneal signs, normal bowel sounds   Assessment and plan: 74 y.o. male with left lower quadrant pain, abnormal sigmoid on May 2016 CT, allergic type reaction to MoviPrep   I do not think he currently has acute diverticulitis. He has had abnormal sigmoid colon in his last colonoscopy was 9 years ago and the prep was described as "less than perfect". I recommended that we try to repeat prep him for colonoscopy. He really seemed to have an allergic reaction to MoviPrep and so this time I will go with a bottle of mag citrate with Fleet enemas on either side. We will do this form across the street because he has had tracheal problems in the past. He will hold his  Coumadin 5 days as he was authorized to do before by his primary care physician.   [name], MD [facility] 08/14/2015, 2:46 PM

## 2018-09-18 ENCOUNTER — Encounter: Payer: Self-pay | Admitting: Gastroenterology

## 2018-10-10 ENCOUNTER — Encounter: Payer: Self-pay | Admitting: Gastroenterology

## 2018-10-10 ENCOUNTER — Ambulatory Visit (INDEPENDENT_AMBULATORY_CARE_PROVIDER_SITE_OTHER): Payer: Medicare Other | Admitting: Gastroenterology

## 2018-10-10 ENCOUNTER — Other Ambulatory Visit (INDEPENDENT_AMBULATORY_CARE_PROVIDER_SITE_OTHER): Payer: Medicare Other

## 2018-10-10 VITALS — BP 132/70 | HR 72 | Ht 69.0 in | Wt 249.4 lb

## 2018-10-10 DIAGNOSIS — Z860100 Personal history of colon polyps, unspecified: Secondary | ICD-10-CM

## 2018-10-10 DIAGNOSIS — R932 Abnormal findings on diagnostic imaging of liver and biliary tract: Secondary | ICD-10-CM | POA: Diagnosis not present

## 2018-10-10 LAB — COMPREHENSIVE METABOLIC PANEL
ALT: 20 U/L (ref 0–53)
AST: 22 U/L (ref 0–37)
Albumin: 4.5 g/dL (ref 3.5–5.2)
Alkaline Phosphatase: 73 U/L (ref 39–117)
BUN: 15 mg/dL (ref 6–23)
CO2: 29 mEq/L (ref 19–32)
Calcium: 9.6 mg/dL (ref 8.4–10.5)
Chloride: 102 mEq/L (ref 96–112)
Creatinine, Ser: 1.05 mg/dL (ref 0.40–1.50)
GFR: 72.74 mL/min (ref >60.00)
Glucose, Bld: 103 mg/dL — ABNORMAL HIGH (ref 70–99)
Potassium: 4.3 mEq/L (ref 3.5–5.1)
Sodium: 139 mEq/L (ref 135–145)
Total Bilirubin: 0.6 mg/dL (ref 0.2–1.2)
Total Protein: 7.8 g/dL (ref 6.0–8.3)

## 2018-10-10 LAB — CBC WITH DIFFERENTIAL/PLATELET
Basophils Absolute: 0.1 10*3/uL (ref 0.0–0.1)
Basophils Relative: 1.2 % (ref 0.0–3.0)
Eosinophils Absolute: 0.6 10*3/uL (ref 0.0–0.7)
Eosinophils Relative: 7.4 % — ABNORMAL HIGH (ref 0.0–5.0)
HCT: 40.8 % (ref 39.0–52.0)
Hemoglobin: 14 g/dL (ref 13.0–17.0)
Lymphocytes Relative: 27 % (ref 12.0–46.0)
Lymphs Abs: 2.4 10*3/uL (ref 0.7–4.0)
MCHC: 34.2 g/dL (ref 30.0–36.0)
MCV: 97.4 fl (ref 78.0–100.0)
Monocytes Absolute: 1.1 10*3/uL — ABNORMAL HIGH (ref 0.1–1.0)
Monocytes Relative: 12.4 % — ABNORMAL HIGH (ref 3.0–12.0)
Neutro Abs: 4.5 10*3/uL (ref 1.4–7.7)
Neutrophils Relative %: 52 % (ref 43.0–77.0)
Platelets: 165 10*3/uL (ref 150.0–400.0)
RBC: 4.19 Mil/uL — ABNORMAL LOW (ref 4.22–5.81)
RDW: 14.5 % (ref 11.5–15.5)
WBC: 8.7 10*3/uL (ref 4.0–10.5)

## 2018-10-10 LAB — PROTIME-INR
INR: 2.3 ratio — ABNORMAL HIGH (ref 0.8–1.0)
Prothrombin Time: 26.3 s — ABNORMAL HIGH (ref 9.6–13.1)

## 2018-10-10 NOTE — Patient Instructions (Addendum)
[place] CT scan 2-4 weeks ago (at hospital in [place]). We will request that CT report.  You will have labs checked today in the basement lab.  Please head down after you check out with the front desk  (cbc, cmet, INR).  You will be set up for a colonoscopy but await more information about your liver, ?cirrhosis. Will need to stop coumadin for 5 days prior.  You may need an upper endoscopy as well.  Thank you for entrusting me with your care and choosing [facility].  Dr Ardis Hughs

## 2018-10-10 NOTE — Progress Notes (Signed)
Review of pertinent gastrointestinal problems: 1.  Personal history of adenomatous polyps : [facility],01[facility]:  "extensive diverticulosis, no evidence of polyps" also "less than ideal prep".  Colonoscopy September 2016 Dr. Ardis [facility] found 6 subcentimeter polyps, all of them were typical tubular adenomas. 2.  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  History of acute sigmoid diverticulitis.   HPI: This is a very pleasant 77 year old man whom I last saw 3 years ago at the time of colonoscopy see those results summarized above.  He is here today to discuss polyp surveillance colonoscopy.  Also in 2016 I ordered a CT scan forearm that suggested he may have underlying cirrhosis.  I do not believe I ever discussed this with him.  We talked about it today at length.  He has never been an alcoholic, does not drink alcohol at all.  He has never had hepatitis.  He has never been told that he might have cirrhosis in the past.  He has no symptoms of cirrhosis.  Specifically no encephalopathy, no edema, no jaundice no signs of encephalopathy.   ivc filter, has been on coumadin ever since.   Had SBO 3 or 4 weeks ago at [facility] due to hernia, adhesions at lap port site; resolved with NG tube decompression.   Chief complaint is personal history of adenomatous polyps, possible underlying cirrhosis  ROS: complete GI ROS as described in HPI, all other review negative.  Constitutional:  No unintentional weight loss   Past Medical History:  Diagnosis Date  . Diverticulitis   . Diverticulosis   . DVT (deep venous thrombosis) ([facility])    "green field filter" right thigh   . History of kidney stones    "lithotripsies"  . Hypertension     Past Surgical History:  Procedure Laterality Date  . CATARACT EXTRACTION Right   . CHOLECYSTECTOMY  2005  . COLONOSCOPY    . COLONOSCOPY N/A 08/20/2015   Procedure: COLONOSCOPY;  Surgeon: Austin Banister,  MD;  Location: [facility];  Service: Endoscopy;  Laterality: N/A;  . HERNIA REPAIR  1985   inguinal  . KNEE SURGERY  2001   left knee scope    Current Outpatient Medications  Medication Sig Dispense Refill  . allopurinol (ZYLOPRIM) 300 MG tablet Take 300 mg by mouth at bedtime.     Marland Kitchen labetalol (NORMODYNE) 200 MG tablet Take 200 mg by mouth 2 (two) times daily.    Marland Kitchen warfarin (COUMADIN) 5 MG tablet Take 5-7.5 mg by mouth every evening. 1 1/2 tablet daily alternating with 1 tablet daily     No current facility-administered medications for this visit.     Allergies as of 10/10/2018 - Review Complete 10/10/2018  Allergen Reaction Noted  . Demerol [meperidine] Nausea And Vomiting 05/27/2015    Family History  Problem Relation Age of Onset  . Stomach cancer Unknown   . Colon cancer Neg Hx   . Colon polyps Neg Hx     Social History   Socioeconomic History  . Marital status: Married    Spouse name: Not on file  . Number of children: 2  . Years of education: Not on file  . Highest education level: Not on file  Occupational History  . Occupation: Retired  Scientific laboratory technician  . Financial resource strain: Not on file  . Food insecurity:    Worry: Not on file    Inability: Not on file  . Transportation needs:    Medical: Not on file  Non-medical: Not on file  Tobacco Use  . Smoking status: Never Smoker  . Smokeless tobacco: Never Used  Substance and Sexual Activity  . Alcohol use: No    Alcohol/week: 0.0 standard drinks  . Drug use: No  . Sexual activity: Not on file  Lifestyle  . Physical activity:    Days per week: Not on file    Minutes per session: Not on file  . Stress: Not on file  Relationships  . Social connections:    Talks on phone: Not on file    Gets together: Not on file    Attends religious service: Not on file    Active member of club or organization: Not on file    Attends meetings of clubs or organizations: Not on file    Relationship status: Not on  file  . Intimate partner violence:    Fear of current or ex partner: Not on file    Emotionally abused: Not on file    Physically abused: Not on file    Forced sexual activity: Not on file  Other Topics Concern  . Not on file  Social History Narrative  . Not on file     Physical Exam: BP 132/70   Pulse 72   Ht 5\' 9"  (1.753 m)   Wt 249 lb 6 oz (113.1 kg)   BMI 36.83 kg/m  Constitutional: generally well-appearing Psychiatric: alert and oriented x3 Abdomen: soft, nontender, nondistended, no obvious ascites, no peritoneal signs, normal bowel sounds No peripheral edema noted in lower extremities  Assessment and plan: 77 y.o. male with personal history of adenomatous polyps, possible history of cirrhosis  I would like to clarify whether or not he has cirrhosis.  He did have a CT scan while he was admitted to [facility] 3 to 4 weeks ago for small bowel obstruction.  We will gather that report for my review.  If it makes a specific comment on his liver then he may not need further imaging.  If it does not I may want him to have an abdominal ultrasound to see if that shows signs of cirrhosis.  He will also get a set of labs today including CBC, complete metabolic profile and coags.  I am going to wait to schedule his polyp surveillance colonoscopy until I have determined whether or not he has underlying cirrhosis because if he does he would need an EGD at same time to screen him for varices.  Please see the "Patient Instructions" section for addition details about the plan.  Austin Loffler, MD [facility] 10/10/2018, 3:24 PM

## 2018-10-16 ENCOUNTER — Encounter: Payer: Self-pay | Admitting: Gastroenterology

## 2018-10-16 ENCOUNTER — Other Ambulatory Visit: Payer: Self-pay | Admitting: Gastroenterology

## 2018-10-16 ENCOUNTER — Telehealth: Payer: Self-pay | Admitting: Gastroenterology

## 2018-10-16 DIAGNOSIS — K7460 Unspecified cirrhosis of liver: Secondary | ICD-10-CM

## 2018-10-16 NOTE — Telephone Encounter (Signed)
   CT scan abdomen pelvis with IV and oral contrast, September 18, 2018.  This was while he was admitted with a bowel obstruction.  The CT scan confirmed a high-grade small bowel obstruction with transition point in the right lower quadrant.  It also specifically noted nodular appearing liver without any focal masses.   Can you please call him.  Let him know that the CT scan that he had done at [facility] last month did confirm cirrhosis in his liver.  He needs a colonoscopy in the [place] for personal history of colon polyps and at the same time an upper endoscopy to screen for esophageal varices.  At that time he can also have alpha-fetoprotein for hepatoma screening.  He is on Coumadin and has been for a long time.  He will need to hold it for 5 days.  Please have his primary care physician, done the safety of that recommendation.

## 2018-10-16 NOTE — Telephone Encounter (Signed)
Spoke to patient,informed him of CT scan results. Patient has an appointment for a pre visit on 11/01/18 to go over instructions for EGD/Colon scheduled for 11/16/18. He will have AFP done at the time of his visit. An Anticoagulation letter has been faxed to Dr Alisa Graff office for Coumadin clearance hold. Patient voiced understanding, I will contact patient regarding his coumadin hold prior to procedure.

## 2018-10-17 ENCOUNTER — Telehealth: Payer: Self-pay

## 2018-10-17 NOTE — Telephone Encounter (Signed)
Spoke to patient to inform him that Dr Lars Pinks cleared patient to be off his coumadin for 1 week. Patient voiced understanding.

## 2018-10-24 ENCOUNTER — Telehealth: Payer: Self-pay | Admitting: *Deleted

## 2018-10-24 NOTE — Telephone Encounter (Signed)
5 day hold is sufficient.  Thanks

## 2018-10-24 NOTE — Telephone Encounter (Signed)
Dr Ardis Hughs, I am preparing this patient's chart for his upcoming previsit. Patient's PCP has responded to hold Coumadin for "one week". I interpret that as 7 days, we l usually hold x 5 days.  Patient has an IVC Filter What should I instruct the patient? Thanks Santiago Glad T

## 2018-11-01 ENCOUNTER — Telehealth: Payer: Self-pay

## 2018-11-01 ENCOUNTER — Other Ambulatory Visit: Payer: Self-pay

## 2018-11-01 ENCOUNTER — Ambulatory Visit (AMBULATORY_SURGERY_CENTER): Payer: Self-pay

## 2018-11-01 VITALS — Ht 69.0 in | Wt 248.8 lb

## 2018-11-01 DIAGNOSIS — R932 Abnormal findings on diagnostic imaging of liver and biliary tract: Secondary | ICD-10-CM

## 2018-11-01 DIAGNOSIS — Z86010 Personal history of colonic polyps: Secondary | ICD-10-CM

## 2018-11-01 MED ORDER — PEG 3350-KCL-NA BICARB-NACL 420 G PO SOLR: 4000.0000 mL | Freq: Once | ORAL | 0 refills | Status: AC

## 2018-11-01 NOTE — Telephone Encounter (Signed)
Called and left a message on pt voicemail, we will not be able to do procedure here at [facility]. The office will schedule the appointment with Elvina Sidle out patient and call you with that appointment. The office will follow up on which prep to take.

## 2018-11-01 NOTE — Progress Notes (Signed)
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 Pt also had a allergic reactions to moviprep causing throat to swell, so his procedure was cancel and reschedule at [facility] a couple months later in the year.  No diet pills per patient No home 02 use per patient  Pt takes Coumadin will hold for 5 days.  Pt denies issues with constipation  No A fib or A flutter  EMMI video sent to pt's e mail , pt declined Pt has a Vena Cava Filter placed aug 2005

## 2018-11-01 NOTE — Telephone Encounter (Signed)
Sharyn Lull,  This pt is a difficult intubation and needs to have his procedure in the hospital.  Thanks,  Osvaldo Angst

## 2018-11-01 NOTE — Telephone Encounter (Signed)
Pt came in this morning for his previsit for a egd/colonoscopy schedule on 12-27 at [facility]. Pt verbalized last time he had his procedure he took Movi prep and he had a allergic reaction and his throat had started to swell so his procedure was canceled and rescheduled months later at [facility]. Pt verbalized he has a "kink" in his trachea and needed to make sure anesthetia was aware of it. Last colon was at [facility] because of trachea and reaction to prep per pt. Dr. Ardis Hughs we have ordered [facility] for his prep. Movi and Golytle has similar ingredients need to know if you want him to take a different prep. He took mag citrate and 2 enemas and prep was fair last time in 07-2015. Please advised if to keep procedure at [facility] and which prep to take.  Thank you

## 2018-11-02 NOTE — Telephone Encounter (Signed)
Which prep do you want him to use?

## 2018-11-02 NOTE — Telephone Encounter (Signed)
OK, thanks.  He will need to be done at [facility], I'll forward this to [facility] as well to help with my first available time (egd/colon).  thanks

## 2018-11-04 NOTE — Telephone Encounter (Signed)
Looks like in 2016 he took a bottle of mag citrate (night before) and then enema the night before and morning of the procedure.  Can you check with him on that and advise him to do the same thing.

## 2018-11-05 NOTE — Telephone Encounter (Signed)
I spoke with the pt and he says that his wife is undergoing Esophageal cancer treatment and would like to call back after the first of the year to set up appt.

## 2018-11-16 ENCOUNTER — Encounter: Payer: Medicare Other | Admitting: Gastroenterology

## 2020-03-27 ENCOUNTER — Ambulatory Visit: Payer: Medicare Other | Admitting: Gastroenterology

## 2020-05-12 ENCOUNTER — Ambulatory Visit: Payer: Medicare Other | Admitting: Gastroenterology

## 2020-07-07 ENCOUNTER — Encounter: Payer: Self-pay | Admitting: Gastroenterology

## 2020-07-07 ENCOUNTER — Other Ambulatory Visit (INDEPENDENT_AMBULATORY_CARE_PROVIDER_SITE_OTHER): Payer: Medicare Other

## 2020-07-07 ENCOUNTER — Ambulatory Visit (INDEPENDENT_AMBULATORY_CARE_PROVIDER_SITE_OTHER): Payer: Medicare Other | Admitting: Gastroenterology

## 2020-07-07 VITALS — BP 132/72 | HR 64 | Ht 69.0 in | Wt 237.0 lb

## 2020-07-07 DIAGNOSIS — Z860100 Personal history of colon polyps, unspecified: Secondary | ICD-10-CM

## 2020-07-07 DIAGNOSIS — K219 Gastro-esophageal reflux disease without esophagitis: Secondary | ICD-10-CM

## 2020-07-07 DIAGNOSIS — Z79899 Other long term (current) drug therapy: Secondary | ICD-10-CM

## 2020-07-07 DIAGNOSIS — K7460 Unspecified cirrhosis of liver: Secondary | ICD-10-CM

## 2020-07-07 LAB — COMPREHENSIVE METABOLIC PANEL
ALT: 13 U/L (ref 0–53)
AST: 20 U/L (ref 0–37)
Albumin: 4.3 g/dL (ref 3.5–5.2)
Alkaline Phosphatase: 64 U/L (ref 39–117)
BUN: 13 mg/dL (ref 6–23)
CO2: 28 mEq/L (ref 19–32)
Calcium: 9.6 mg/dL (ref 8.4–10.5)
Chloride: 105 mEq/L (ref 96–112)
Creatinine, Ser: 1.04 mg/dL (ref 0.40–1.50)
GFR: 68.89 mL/min (ref >60.00)
Glucose, Bld: 100 mg/dL — ABNORMAL HIGH (ref 70–99)
Potassium: 4.2 mEq/L (ref 3.5–5.1)
Sodium: 142 mEq/L (ref 135–145)
Total Bilirubin: 0.9 mg/dL (ref 0.2–1.2)
Total Protein: 7.3 g/dL (ref 6.0–8.3)

## 2020-07-07 LAB — CBC
HCT: 39.1 % (ref 39.0–52.0)
Hemoglobin: 13.3 g/dL (ref 13.0–17.0)
MCHC: 33.9 g/dL (ref 30.0–36.0)
MCV: 99.4 fl (ref 78.0–100.0)
Platelets: 162 10*3/uL (ref 150.0–400.0)
RBC: 3.94 Mil/uL — ABNORMAL LOW (ref 4.22–5.81)
RDW: 14.6 % (ref 11.5–15.5)
WBC: 8 10*3/uL (ref 4.0–10.5)

## 2020-07-07 LAB — PROTIME-INR
INR: 2.2 ratio — ABNORMAL HIGH (ref 0.8–1.0)
Prothrombin Time: 24.1 s — ABNORMAL HIGH (ref 9.6–13.1)

## 2020-07-07 NOTE — Patient Instructions (Addendum)
If you are age 79 or older, your body mass index should be between 23-30. Your Body mass index is 35 kg/m. If this is out of the aforementioned range listed, please consider follow up with your Primary Care Provider.  If you are age 1 or younger, your body mass index should be between 19-25. Your Body mass index is 35 kg/m. If this is out of the aformentioned range listed, please consider follow up with your Primary Care Provider.   Your provider has requested that you go to the basement level for lab work before leaving today. Press "B" on the elevator. The lab is located at the first door on the left as you exit the elevator.  You have been scheduled for an endoscopy and colonoscopy. Please follow the written instructions given to you at your visit today. Please pick up your prep supplies at the pharmacy within the next 1-3 days. If you use inhalers (even only as needed), please bring them with you on the day of your procedure.  You will be contacted by our office prior to your procedure for directions on holding your warfarin/Coumadin.  If you do not hear from our office 1 week prior to your scheduled procedure, please call [phone] to discuss.    Due to recent changes in healthcare laws, you may see the results of your imaging and laboratory studies on MyChart before your provider has had a chance to review them.  We understand that in some cases there may be results that are confusing or concerning to you. Not all laboratory results come back in the same time frame and the provider may be waiting for multiple results in order to interpret others.  Please give [place] 48 hours in order for your provider to thoroughly review all the results before contacting the office for clarification of your results.   Thank you for entrusting me with your care and choosing [facility].  Dr Ardis Hughs

## 2020-07-07 NOTE — Progress Notes (Signed)
Review of pertinent gastrointestinal problems: 1.  Personal history of adenomatous polyps : colonoscopy [facility]. [place] [facility],01/2006: "extensive diverticulosis, no evidence of polyps" also "less than ideal prep".  Colonoscopy September 2016 Dr. Ardis Hughs found 6 subcentimeter polyps, all of them were typical tubular adenomas. 2.  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  History of acute sigmoid diverticulitis. 3. Cirrhosis; diagnosed by imaging October 2019 while in [place] with a high-grade small bowel obstruction, transition point right lower quadrant. It noted a "nodular appearing liver without any focal masses". Blood work November 2019 showed completely normal liver test. His INR is elevated from his chronic Coumadin use. His platelets were normal  HPI: This is a very pleasant 79 year old man whom I last saw about 2 years ago  I last saw him about 2 years ago. We were working on a colonoscopy for him for personal history of adenomatous polyps and also an EGD to screen him for esophageal varices given his very recent diagnosis of cirrhosis based on outside imaging. He ended up never having those procedures done. This is the first time hearing from him since then.  He has had another bowel obstruction since I last saw him.  Is undoubtedly related to his complicated gallbladder surgery 2005 when he underwent laparoscopic cholecystectomy followed by abscess followed by eventual reoperation, open exploration, cleanout, drain placement.  He has had 2 bowel obstructions.  He feels well, he has lost 35 pounds in the past 2 years.  His wife recently passed away from esophageal cancer.  That was what led to a lot of his delay in his care previously.  He has had no overt GI bleeding.  No significant edema.  ROS: complete GI ROS as described in HPI, all other review negative.  Constitutional:  No unintentional weight loss   Past Medical History:  Diagnosis Date  .  Cataract   . Diverticulitis   . Diverticulosis   . DVT (deep venous thrombosis) ([place])    "green field filter" right thigh   . History of kidney stones    "lithotripsies"  . Hypertension   . 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 CATARACT EXTRACTION Right   . CHOLECYSTECTOMY  2005  . COLONOSCOPY    . COLONOSCOPY N/A 08/20/2015   Procedure: COLONOSCOPY;  Surgeon: [name], MD;  Location: [facility];  Service: Endoscopy;  Laterality: N/A;  . HERNIA REPAIR  1985   inguinal  . KNEE SURGERY  2001   left knee scope  . POLYPECTOMY      Current Outpatient Medications  Medication Sig Dispense Refill  . allopurinol (ZYLOPRIM) 300 MG tablet Take 300 mg by mouth at bedtime.     Marland Kitchen labetalol (NORMODYNE) 200 MG tablet Take 200 mg by mouth 2 (two) times daily.    Marland Kitchen warfarin (COUMADIN) 5 MG tablet Take 5-7.5 mg by mouth every evening. 1 1/2 tablet daily alternating with 1 tablet daily     No current facility-administered medications for this visit.    Allergies as of 07/07/2020 - Review Complete 11/01/2018  Allergen Reaction Noted  . Moviprep [peg-kcl-nacl-nasulf-na asc-c] Swelling 07/07/2020  . Demerol [meperidine] Nausea And Vomiting 05/27/2015  . Golytely [peg 3350-electrolytes] Swelling 07/07/2020    Family History  Problem Relation Age of Onset  . Stomach cancer Other   . Colon cancer Neg Hx   . Colon polyps Neg Hx   . Esophageal cancer Neg Hx   .  Rectal cancer Neg Hx     Social History   Socioeconomic History  . Marital status: Married    Spouse name: Not on file  . Number of children: 2  . Years of education: Not on file  . Highest education level: Not on file  Occupational History  . Occupation: Retired  Tobacco Use  . Smoking status: Never Smoker  . Smokeless tobacco: Never Used  Vaping Use  . Vaping Use: Never used  Substance and Sexual Activity  . Alcohol use: No    Alcohol/week: 0.0  standard drinks  . Drug use: No  . Sexual activity: Not on file  Other Topics Concern  . Not on file  Social History Narrative  . Not on file   Social Determinants of Health   Financial Resource Strain:   . Difficulty of Paying Living Expenses:   Food Insecurity:   . Worried About Charity fundraiser in the Last Year:   . Arboriculturist in the Last Year:   Transportation Needs:   . Film/video editor (Medical):   Marland Kitchen Lack of Transportation (Non-Medical):   Physical Activity:   . Days of Exercise per Week:   . Minutes of Exercise per Session:   Stress:   . Feeling of Stress :   Social Connections:   . Frequency of Communication with Friends and Family:   . Frequency of Social Gatherings with Friends and Family:   . Attends Religious Services:   . Active Member of Clubs or Organizations:   . Attends Archivist Meetings:   Marland Kitchen Marital Status:   Intimate Partner Violence:   . Fear of Current or Ex-Partner:   . Emotionally Abused:   Marland Kitchen Physically Abused:   . Sexually Abused:      Physical Exam: BP 132/72   Pulse 64   Ht 5\' 9"  (1.753 m)   Wt 237 lb (107.5 kg)   SpO2 98%   BMI 35.00 kg/m  Constitutional: generally well-appearing Psychiatric: alert and oriented x3 Abdomen: soft, nontender, nondistended, no obvious ascites, no peritoneal signs, normal bowel sounds No peripheral edema noted in lower extremities  Assessment and plan: 79 y.o. male with personal history of adenomatous polyps, likely cirrhosis based on outside imaging  For some to repeat basic lab tests clearly CBC, complete metabolic profile coags and alpha-fetoprotein.  Outside imaging suggested that he had a nodular liver.  He did screening for varices and he also needs surveillance for polyps that were precancerous previously.  Upper endoscopy and colonoscopy will be done at the same time at [facility] given difficult previous intubation.  He has had difficulties with movie prep in  the past.  He was told GoLYTELY would be just as bad.  We are going to patch work a prep together for him which will include 2 bottles of mag citrate, Dulcolax, and 2 enemas.  He is on Coumadin does he need to hold it for 5 days prior to his procedures.  We will contact his PCP about the safety of that recommendation as he is the prescribing provider.  Please see the "Patient Instructions" section for addition details about the plan.  [name], MD [facility] 07/07/2020, 10:14 AM   Total time on date of encounter was 34minutes (this included time spent preparing to see the patient reviewing records; obtaining and/or reviewing separately obtained history; performing a medically appropriate exam and/or evaluation; counseling and educating the patient and family if present; ordering medications,  tests or procedures if applicable; and documenting clinical information in the health record).

## 2020-07-08 LAB — AFP TUMOR MARKER: AFP-Tumor Marker: 1.5 ng/mL (ref <6.1)

## 2020-07-30 ENCOUNTER — Telehealth: Payer: Self-pay

## 2020-07-30 NOTE — Telephone Encounter (Signed)
Patient advised to hold Warfarin starting on after 08-21-20 prior to colonoscopy and endoscopy on 08-27-20. Patient agreed to plan and verbalized understanding.  No further questions.

## 2020-08-20 ENCOUNTER — Telehealth: Payer: Self-pay | Admitting: Gastroenterology

## 2020-08-20 NOTE — Telephone Encounter (Signed)
We did discuss instructions and he verbalized understanding. I offered an opportunity for him to ask questions and he felt comfortable with the information.

## 2020-08-20 NOTE — Telephone Encounter (Signed)
Pt is requesting a call back from a nurse to reschedule his procedure scheduled 08/27/2020 at the hospital.

## 2020-08-20 NOTE — Telephone Encounter (Signed)
The pt has decided to keep appt as planned.  He will have COVID test on Monday

## 2020-08-24 ENCOUNTER — Other Ambulatory Visit (HOSPITAL_COMMUNITY)
Admission: RE | Admit: 2020-08-24 | Discharge: 2020-08-24 | Disposition: A | Discharge status: Home / Self Care | Payer: Medicare Other | Source: Ambulatory Visit | Attending: Gastroenterology | Admitting: Gastroenterology

## 2020-08-24 DIAGNOSIS — Z20822 Contact with and (suspected) exposure to covid-19: Secondary | ICD-10-CM | POA: Insufficient documentation

## 2020-08-24 DIAGNOSIS — Z01812 Encounter for preprocedural laboratory examination: Secondary | ICD-10-CM | POA: Diagnosis present

## 2020-08-24 LAB — SARS CORONAVIRUS 2 (TAT 6-24 HRS): SARS Coronavirus 2: NEGATIVE

## 2020-08-26 ENCOUNTER — Telehealth: Payer: Self-pay | Admitting: Gastroenterology

## 2020-08-26 MED ORDER — SODIUM CHLORIDE 0.9 % IV SOLN: INTRAVENOUS | Status: DC

## 2020-08-26 NOTE — Telephone Encounter (Signed)
The pt states that he actually had a BM after calling the office and he will proceed with the prep as planned tonight and procedure tomorrow.

## 2020-08-27 ENCOUNTER — Other Ambulatory Visit: Payer: Self-pay

## 2020-08-27 ENCOUNTER — Encounter (HOSPITAL_COMMUNITY)
Admission: RE | Disposition: A | Discharge status: Home / Self Care | Payer: Self-pay | Source: Home / Self Care | Attending: Gastroenterology

## 2020-08-27 ENCOUNTER — Ambulatory Visit (HOSPITAL_COMMUNITY): Payer: Medicare Other | Admitting: Anesthesiology

## 2020-08-27 ENCOUNTER — Ambulatory Visit (HOSPITAL_COMMUNITY)
Admission: RE | Admit: 2020-08-27 | Discharge: 2020-08-27 | Disposition: A | Discharge status: Home / Self Care | Payer: Medicare Other | Attending: Gastroenterology | Admitting: Gastroenterology

## 2020-08-27 ENCOUNTER — Encounter (HOSPITAL_COMMUNITY): Payer: Self-pay | Admitting: Gastroenterology

## 2020-08-27 DIAGNOSIS — K219 Gastro-esophageal reflux disease without esophagitis: Secondary | ICD-10-CM

## 2020-08-27 DIAGNOSIS — K2970 Gastritis, unspecified, without bleeding: Secondary | ICD-10-CM | POA: Diagnosis not present

## 2020-08-27 DIAGNOSIS — K7460 Unspecified cirrhosis of liver: Secondary | ICD-10-CM | POA: Diagnosis not present

## 2020-08-27 DIAGNOSIS — Z1211 Encounter for screening for malignant neoplasm of colon: Secondary | ICD-10-CM | POA: Insufficient documentation

## 2020-08-27 DIAGNOSIS — E669 Obesity, unspecified: Secondary | ICD-10-CM | POA: Insufficient documentation

## 2020-08-27 DIAGNOSIS — Z86718 Personal history of other venous thrombosis and embolism: Secondary | ICD-10-CM | POA: Diagnosis not present

## 2020-08-27 DIAGNOSIS — K2950 Unspecified chronic gastritis without bleeding: Secondary | ICD-10-CM | POA: Diagnosis not present

## 2020-08-27 DIAGNOSIS — D120 Benign neoplasm of cecum: Secondary | ICD-10-CM | POA: Diagnosis not present

## 2020-08-27 DIAGNOSIS — Z86010 Personal history of colonic polyps: Secondary | ICD-10-CM | POA: Diagnosis not present

## 2020-08-27 DIAGNOSIS — Z6834 Body mass index (BMI) 34.0-34.9, adult: Secondary | ICD-10-CM | POA: Insufficient documentation

## 2020-08-27 DIAGNOSIS — K648 Other hemorrhoids: Secondary | ICD-10-CM | POA: Insufficient documentation

## 2020-08-27 DIAGNOSIS — K5730 Diverticulosis of large intestine without perforation or abscess without bleeding: Secondary | ICD-10-CM | POA: Insufficient documentation

## 2020-08-27 DIAGNOSIS — Z885 Allergy status to narcotic agent status: Secondary | ICD-10-CM | POA: Insufficient documentation

## 2020-08-27 DIAGNOSIS — K635 Polyp of colon: Secondary | ICD-10-CM

## 2020-08-27 DIAGNOSIS — Z888 Allergy status to other drugs, medicaments and biological substances status: Secondary | ICD-10-CM | POA: Insufficient documentation

## 2020-08-27 DIAGNOSIS — Z7901 Long term (current) use of anticoagulants: Secondary | ICD-10-CM | POA: Diagnosis not present

## 2020-08-27 HISTORY — PX: COLONOSCOPY WITH PROPOFOL: SHX5780

## 2020-08-27 HISTORY — DX: Failed or difficult intubation, initial encounter: T88.4XXA

## 2020-08-27 HISTORY — PX: ESOPHAGOGASTRODUODENOSCOPY (EGD) WITH PROPOFOL: SHX5813

## 2020-08-27 HISTORY — DX: Other complications of anesthesia, initial encounter: T88.59XA

## 2020-08-27 HISTORY — PX: POLYPECTOMY: SHX5525

## 2020-08-27 HISTORY — PX: BIOPSY: SHX5522

## 2020-08-27 SURGERY — COLONOSCOPY WITH PROPOFOL
Anesthesia: Monitor Anesthesia Care

## 2020-08-27 MED ORDER — PROPOFOL 500 MG/50ML IV EMUL
INTRAVENOUS | Status: DC | PRN
  Administered 2020-08-27: 125 ug/kg/min via INTRAVENOUS

## 2020-08-27 MED ORDER — PROPOFOL 10 MG/ML IV BOLUS
INTRAVENOUS | Status: DC | PRN
  Administered 2020-08-27: 30 mg via INTRAVENOUS

## 2020-08-27 MED ORDER — LACTATED RINGERS IV SOLN
INTRAVENOUS | Status: DC
  Administered 2020-08-27: 1000 mL via INTRAVENOUS

## 2020-08-27 MED ORDER — PROPOFOL 500 MG/50ML IV EMUL
INTRAVENOUS | Status: AC
  Filled 2020-08-27: qty 50

## 2020-08-27 MED ORDER — SODIUM CHLORIDE 0.9 % IV SOLN: INTRAVENOUS | Status: DC

## 2020-08-27 MED ORDER — LIDOCAINE 2% (20 MG/ML) 5 ML SYRINGE
INTRAMUSCULAR | Status: DC | PRN
  Administered 2020-08-27: 80 mg via INTRAVENOUS

## 2020-08-27 SURGICAL SUPPLY — 24 items

## 2020-08-27 NOTE — Op Note (Signed)
[facility] Patient Name: Austin Guzman Procedure Date: 08/27/2020 MRN: [id number] Attending MD: [name] , MD Date of Birth: Dec 14, 1940 CSN: [id number] Age: 79 Admit Type: Outpatient Procedure:                Upper GI endoscopy Indications:              Cirrhosis, screening for varices Providers:                [name], MD, Josie Dixon, RN, Particia Nearing, RN, Fransico Setters Mbumina, Technician Referring MD:              Medicines:                Monitored Anesthesia Care Complications:            No immediate complications. Estimated blood loss:                            None. Estimated Blood Loss:     Estimated blood loss: none. Procedure:                Pre-Anesthesia Assessment:                           - Prior to the procedure, a History and Physical                            was performed, and patient medications and                            allergies were reviewed. The patient's tolerance of                            previous anesthesia was also reviewed. The risks                            and benefits of the procedure and the sedation                            options and risks were discussed with the patient.                            All questions were answered, and informed consent                            was obtained. Prior Anticoagulants: The patient has                            taken Coumadin (warfarin), last dose was 5 days                            prior to procedure. ASA Grade Assessment: IV - A  patient with severe systemic disease that is a                            constant threat to life. After reviewing the risks                            and benefits, the patient was deemed in                            satisfactory condition to undergo the procedure.                           After obtaining informed consent, the endoscope was                            passed  under direct vision. Throughout the                            procedure, the patient's blood pressure, pulse, and                            oxygen saturations were monitored continuously. The                            [id number]-H190 ([id number]) Olympus gastroscope was                            introduced through the mouth, and advanced to the                            second part of duodenum. The upper GI endoscopy was                            accomplished without difficulty. The patient                            tolerated the procedure well. 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      Moderate inflammation characterized by erosions, erythema and friability       was found in the gastric antrum. Biopsies were taken with a cold forceps       for histology.      The exam was otherwise without abnormality. No esophageal or gastric       varices. Impression:               - Suggestion of mild portal gastropathy in the                            proximal stomach.                           - Moderate non-specific distal gastritis, biospsied  to check for H. pylori                           - The examination was otherwise normal. Moderate Sedation:      Not Applicable - Patient had care per Anesthesia. Recommendation:           - Await pathology results.                           - Colonoscopy now. Procedure Code(s):        --- Professional ---                           [id number], Esophagogastroduodenoscopy, flexible,                            transoral; with biopsy, single or multiple Diagnosis Code(s):        --- Professional ---                           K29.70, Gastritis, unspecified, without bleeding                           R10.13, Epigastric pain CPT copyright 2019 American Medical Association. All rights reserved. The codes documented in this report are preliminary and upon coder review may  be  revised to meet current compliance requirements. [name], MD 08/27/2020 8:48:23 AM This report has been signed electronically. Number of Addenda: 0

## 2020-08-27 NOTE — Anesthesia Postprocedure Evaluation (Signed)
Anesthesia Post Note  Patient: Austin Guzman  Procedure(s) Performed: COLONOSCOPY WITH PROPOFOL (N/A ) ESOPHAGOGASTRODUODENOSCOPY (EGD) WITH PROPOFOL (N/A ) BIOPSY POLYPECTOMY     Patient location during evaluation: PACU Anesthesia Type: MAC Level of consciousness: awake and alert Pain management: pain level controlled Vital Signs Assessment: post-procedure vital signs reviewed and stable Respiratory status: spontaneous breathing and respiratory function stable Cardiovascular status: stable Postop Assessment: no apparent nausea or vomiting Anesthetic complications: no   No complications documented.  Last Vitals:  Vitals:   08/27/20 0950 08/27/20 0955  BP: 133/73   Pulse: 60 (!) 55  Resp: 15 15  Temp:    SpO2: 95% 95%    Last Pain:  Vitals:   08/27/20 0950  TempSrc:   PainSc: 0-No pain                 Merlinda Frederick

## 2020-08-27 NOTE — Transfer of Care (Signed)
Immediate Anesthesia Transfer of Care Note  Patient: Austin Guzman  Procedure(s) Performed: COLONOSCOPY WITH PROPOFOL (N/A ) ESOPHAGOGASTRODUODENOSCOPY (EGD) WITH PROPOFOL (N/A ) BIOPSY POLYPECTOMY  Patient Location: PACU  Anesthesia Type:MAC  Level of Consciousness: awake, alert  and oriented  Airway & Oxygen Therapy: Patient Spontanous Breathing and Patient connected to face mask oxygen  Post-op Assessment: Report given to RN and Post -op Vital signs reviewed and stable  Post vital signs: Reviewed and stable  Last Vitals:  Vitals Value Taken Time  BP 115/67 08/27/20 0934  Temp    Pulse 69 08/27/20 0935  Resp 13 08/27/20 0935  SpO2 95 % 08/27/20 0935  Vitals shown include unvalidated device data.  Last Pain:  Vitals:   08/27/20 0933  TempSrc: Oral  PainSc: 0-No pain         Complications: No complications documented.

## 2020-08-27 NOTE — Op Note (Signed)
[facility] Patient Name: Austin Guzman Procedure Date: 08/27/2020 MRN: [id number] Attending MD: [name] , MD Date of Birth: February 05, 1941 CSN: [id number] Age: 79 Admit Type: Outpatient Procedure:                Colonoscopy Indications:              High risk colon cancer surveillance: Personal                            history of colonic polyps Providers:                [name], MD, Josie Dixon, RN, Particia Nearing, RN, Fransico Setters Mbumina, Technician Referring MD:              Medicines:                Monitored Anesthesia Care Complications:            No immediate complications. Estimated blood loss:                            None. Estimated Blood Loss:     Estimated blood loss: none. Procedure:                Pre-Anesthesia Assessment:                           - Prior to the procedure, a History and Physical                            was performed, and patient medications and                            allergies were reviewed. The patient's tolerance of                            previous anesthesia was also reviewed. The risks                            and benefits of the procedure and the sedation                            options and risks were discussed with the patient.                            All questions were answered, and informed consent                            was obtained. Prior Anticoagulants: The patient has                            taken Coumadin (warfarin), last dose was 5 days  prior to procedure. ASA Grade Assessment: IV - A                            patient with severe systemic disease that is a                            constant threat to life. After reviewing the risks                            and benefits, the patient was deemed in                            satisfactory condition to undergo the procedure.                           - Prior to the procedure, a  History and Physical                            was performed, and patient medications and                            allergies were reviewed. The patient's tolerance of                            previous anesthesia was also reviewed. The risks                            and benefits of the procedure and the sedation                            options and risks were discussed with the patient.                            All questions were answered, and informed consent                            was obtained. Prior Anticoagulants: The patient has                            taken Coumadin (warfarin), last dose was 5 days                            prior to procedure. ASA Grade Assessment: IV - A                            patient with severe systemic disease that is a                            constant threat to life. After reviewing the risks                            and benefits, the patient was deemed in  satisfactory condition to undergo the procedure.                           After obtaining informed consent, the colonoscope                            was passed under direct vision. Throughout the                            procedure, the patient's blood pressure, pulse, and                            oxygen saturations were monitored continuously. The                            CF-HQ190L ([id number]) Olympus colonoscope was                            introduced through the anus and advanced to the the                            cecum, identified by appendiceal orifice and                            ileocecal valve. The colonoscopy was performed                            without difficulty. The patient tolerated the                            procedure well. The quality of the bowel                            preparation was adequate. The ileocecal valve,                            appendiceal orifice, and rectum were photographed. Scope In: 9:07:51  AM Scope Out: 9:30:02 AM Scope Withdrawal Time: 0 hours 14 minutes 5 seconds  Total Procedure Duration: 0 hours 22 minutes 11 seconds  Findings:      Three sessile polyps were found in the transverse colon and cecum. The       polyps were 2 to 5 mm in size. These polyps were removed with a cold       snare. Resection and retrieval were complete.      Multiple small and large-mouthed diverticula were found in the entire       colon.      Medium sized internal hemorrhoids.      The exam was otherwise without abnormality on direct and retroflexion       views. Impression:               - Three 2 to 5 mm polyps in the transverse colon                            and in the cecum, removed with a cold  snare.                            Resected and retrieved.                           - Diverticulosis in the entire examined colon.                           - Hemorrhoids.                           - The examination was otherwise normal on direct                            and retroflexion views. Moderate Sedation:      Not Applicable - Patient had care per Anesthesia. Recommendation:           - Patient has a contact number available for                            emergencies. The signs and symptoms of potential                            delayed complications were discussed with the                            patient. Return to normal activities tomorrow.                            Written discharge instructions were provided to the                            patient.                           - Resume previous diet.                           - Continue present medications. OK to resume your                            coumadin tomorrow.                           - Await pathology results. Procedure Code(s):        --- Professional ---                           [id number], Colonoscopy, flexible; with removal of                            tumor(s), polyp(s), or other lesion(s) by snare                             technique Diagnosis Code(s):        --- Professional ---  Z86.010, Personal history of colonic polyps                           K63.5, Polyp of colon                           K57.30, Diverticulosis of large intestine without                            perforation or abscess without bleeding CPT copyright 2019 American Medical Association. All rights reserved. The codes documented in this report are preliminary and upon coder review may  be revised to meet current compliance requirements. [name], MD 08/27/2020 9:33:43 AM This report has been signed electronically. Number of Addenda: 0

## 2020-08-27 NOTE — Anesthesia Preprocedure Evaluation (Signed)
Anesthesia Evaluation  Patient identified by MRN, date of birth, ID band Patient awake    Reviewed: Allergy & Precautions, NPO status , Patient's Chart, lab work & pertinent test results, reviewed documented beta blocker date and time   History of Anesthesia Complications (+) DIFFICULT AIRWAYNegative for: history of anesthetic complications  Airway Mallampati: III  TM Distance: <3 FB Neck ROM: Full    Dental  (+) Teeth Intact, Dental Advisory Given   Pulmonary neg pulmonary ROS,    Pulmonary exam normal breath sounds clear to auscultation       Cardiovascular hypertension, Pt. on medications and Pt. on home beta blockers (-) angina+ DVT (on coumadin)  (-) Past MI Normal cardiovascular exam Rhythm:Regular Rate:Normal     Neuro/Psych negative neurological ROS  negative psych ROS   GI/Hepatic negative GI ROS, Neg liver ROS,   Endo/Other  Obesity   Renal/GU negative Renal ROS     Musculoskeletal negative musculoskeletal ROS (+)   Abdominal   Peds  Hematology negative hematology ROS (+)   Anesthesia Other Findings Day of surgery medications reviewed with the patient.  Reproductive/Obstetrics                             Anesthesia Physical  Anesthesia Plan  ASA: II  Anesthesia Plan: MAC   Post-op Pain Management:    Induction: Intravenous  PONV Risk Score and Plan: 1 and TIVA, Treatment may vary due to age or medical condition and Propofol infusion  Airway Management Planned: Simple Face Mask and Natural Airway  Additional Equipment:   Intra-op Plan:   Post-operative Plan:   Informed Consent: I have reviewed the patients History and Physical, chart, labs and discussed the procedure including the risks, benefits and alternatives for the proposed anesthesia with the patient or authorized representative who has indicated his/her understanding and acceptance.       Plan  Discussed with: CRNA and Anesthesiologist  Anesthesia Plan Comments: (Discussed risks/benefits/alternatives to MAC sedation including need for ventilatory support, hypotension, need for conversion to general anesthesia.  All patient questions answered.  Patient wished to proceed.)        Anesthesia Quick Evaluation

## 2020-08-27 NOTE — H&P (Signed)
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  Constitutional:  No unintentional weight loss   Past Medical History:  Diagnosis Date  . Cataract   . Diverticulitis   . Diverticulosis   . DVT (deep venous thrombosis) ([place])    "green field filter" right thigh   . History of kidney stones    "lithotripsies"  . Hypertension   . 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 CATARACT EXTRACTION Right   . CHOLECYSTECTOMY  2005  . COLONOSCOPY    . COLONOSCOPY N/A 08/20/2015   Procedure: COLONOSCOPY;  Surgeon: [name], MD;  Location: [facility];  Service: Endoscopy;  Laterality: N/A;  . HERNIA REPAIR  1985   inguinal  . KNEE SURGERY  2001   left knee scope  . POLYPECTOMY      Current Facility-Administered Medications  Medication Dose Route Frequency Provider Last Rate Last Admin  . 0.9 %  sodium chloride infusion   Intravenous Continuous [name], MD      . lactated ringers infusion   Intravenous Continuous [name], MD        Allergies as of 07/07/2020 - Review Complete 07/07/2020  Allergen Reaction Noted  . Moviprep [peg-kcl-nacl-nasulf-na asc-c] Swelling 07/07/2020  . Demerol [meperidine] Nausea And Vomiting 05/27/2015  . Golytely [peg 3350-electrolytes] Swelling 07/07/2020    Family History  Problem Relation Age of Onset  . Stomach cancer Other   . Colon cancer Neg Hx   . Colon polyps Neg Hx   . Esophageal cancer Neg Hx   . Rectal cancer Neg Hx     Social History   Socioeconomic History  . Marital status: Married    Spouse name: Not on file  . Number of children: 2  . Years of education: Not on file  . Highest education level: Not on file  Occupational History  . Occupation: Retired  Tobacco Use  . Smoking status: Never Smoker  . Smokeless tobacco: Never Used  Vaping Use   . Vaping Use: Never used  Substance and Sexual Activity  . Alcohol use: No    Alcohol/week: 0.0 standard drinks  . Drug use: No  . Sexual activity: Not on file  Other Topics Concern  . Not on file  Social History Narrative  . Not on file   Social Determinants of Health   Financial Resource Strain:   . Difficulty of Paying Living Expenses: Not on file  Food Insecurity:   . Worried About Charity fundraiser in the Last Year: Not on file  . Ran Out of Food in the Last Year: Not on file  Transportation Needs:   . Lack of Transportation (Medical): Not on file  . Lack of Transportation (Non-Medical): Not on file  Physical Activity:   . Days of Exercise per Week: Not on file  . Minutes of Exercise per Session: Not on file  Stress:   . Feeling of Stress : Not on file  Social Connections:   . Frequency of Communication with Friends and Family: Not on file  . Frequency of Social Gatherings with Friends and Family: Not on file  . Attends Religious Services: Not on file  . Active Member of Clubs or Organizations: Not on file  . Attends Archivist Meetings: Not on file  . Marital  Status: Not on file  Intimate Partner Violence:   . Fear of Current or Ex-Partner: Not on file  . Emotionally Abused: Not on file  . Physically Abused: Not on file  . Sexually Abused: Not on file     Physical Exam: There were no vitals taken for this visit. Constitutional: generally well-appearing Psychiatric: alert and oriented x3 Abdomen: soft, nontender, nondistended, no obvious ascites, no peritoneal signs, normal bowel sounds No peripheral edema noted in lower extremities  Assessment and plan: 79 y.o. male with cirhrosis, PH of polyps on cirrhosis (stopped 5 days ago)  For colonoscopy and EGD today.  Please see the "Patient Instructions" section for addition details about the plan.  [name], MD [facility] 08/27/2020, 7:26 AM

## 2020-08-27 NOTE — Discharge Instructions (Signed)
YOU HAD AN ENDOSCOPIC PROCEDURE TODAY: Refer to the procedure report and other information in the discharge instructions given to you for any specific questions about what was found during the examination. If this information does not answer your questions, please call [place] office at [phone] to clarify.  ° °YOU SHOULD EXPECT: Some feelings of bloating in the abdomen. Passage of more gas than usual. Walking can help get rid of the air that was put into your GI tract during the procedure and reduce the bloating. If you had a lower endoscopy (such as a colonoscopy or flexible sigmoidoscopy) you may notice spotting of blood in your stool or on the toilet paper. Some abdominal soreness may be present for a day or two, also. ° °DIET: Your first meal following the procedure should be a light meal and then it is ok to progress to your normal diet. A half-sandwich or bowl of soup is an example of a good first meal. Heavy or fried foods are harder to digest and may make you feel nauseous or bloated. Drink plenty of fluids but you should avoid alcoholic beverages for 24 hours. If you had a esophageal dilation, please see attached instructions for diet.   ° °ACTIVITY: Your care partner should take you home directly after the procedure. You should plan to take it easy, moving slowly for the rest of the day. You can resume normal activity the day after the procedure however YOU SHOULD NOT DRIVE, use power tools, machinery or perform tasks that involve climbing or major physical exertion for 24 hours (because of the sedation medicines used during the test).  ° °SYMPTOMS TO REPORT IMMEDIATELY: °A gastroenterologist can be reached at any hour. Please call [phone]  for any of the following symptoms:  °Following lower endoscopy (colonoscopy, flexible sigmoidoscopy) °Excessive amounts of blood in the stool  °Significant tenderness, worsening of abdominal pains  °Swelling of the abdomen that is new, acute  °Fever of 100° or  higher  °Following upper endoscopy (EGD, EUS, ERCP, esophageal dilation) °Vomiting of blood or coffee ground material  °New, significant abdominal pain  °New, significant chest pain or pain under the shoulder blades  °Painful or persistently difficult swallowing  °New shortness of breath  °Black, tarry-looking or red, bloody stools ° °FOLLOW UP:  °If any biopsies were taken you will be contacted by phone or by letter within the next 1-3 weeks. Call [phone]  if you have not heard about the biopsies in 3 weeks.  °Please also call with any specific questions about appointments or follow up tests. ° °

## 2020-08-28 ENCOUNTER — Other Ambulatory Visit: Payer: Self-pay

## 2020-08-28 LAB — SURGICAL PATHOLOGY

## 2020-08-31 ENCOUNTER — Encounter (HOSPITAL_COMMUNITY): Payer: Self-pay | Admitting: Gastroenterology

## 2021-05-12 ENCOUNTER — Ambulatory Visit: Payer: Medicare Other | Admitting: Dermatology

## 2021-07-14 ENCOUNTER — Ambulatory Visit: Payer: Medicare Other | Admitting: Dermatology

## 2021-10-25 ENCOUNTER — Ambulatory Visit: Payer: Medicare Other | Admitting: Dermatology

## 2022-01-24 ENCOUNTER — Ambulatory Visit: Payer: Medicare Other | Admitting: Dermatology

## 2022-04-04 ENCOUNTER — Ambulatory Visit: Payer: Medicare Other | Admitting: Dermatology

## 2022-04-22 ENCOUNTER — Emergency Department (HOSPITAL_COMMUNITY): Payer: Medicare Other

## 2022-04-22 ENCOUNTER — Emergency Department (HOSPITAL_COMMUNITY)
Admission: EM | Admit: 2022-04-22 | Discharge: 2022-04-22 | Disposition: A | Discharge status: Home / Self Care | Payer: Medicare Other | Attending: Emergency Medicine | Admitting: Emergency Medicine

## 2022-04-22 ENCOUNTER — Encounter (HOSPITAL_COMMUNITY): Payer: Self-pay | Admitting: *Deleted

## 2022-04-22 DIAGNOSIS — M25562 Pain in left knee: Secondary | ICD-10-CM | POA: Insufficient documentation

## 2022-04-22 MED ORDER — DICLOFENAC SODIUM 1 % EX GEL
2.0000 g | Freq: Four times a day (QID) | CUTANEOUS | 0 refills | Status: DC
Start: 2022-04-22 — End: 2023-01-28

## 2022-04-22 NOTE — ED Provider Notes (Signed)
[facility] EMERGENCY DEPARTMENT Provider Note   CSN: [id number] Arrival date & time: 04/22/22  1330     History  Chief Complaint  Patient presents with   Leg Pain    Austin Guzman is a 81 y.o. male.  HPI  Patient is a an 81 year old male who presents to the emergency department due to atraumatic left knee pain and swelling.  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  About 2 weeks he had a bout of acute diverticulitis and this medication was discontinued for about 6 days.  1 week ago he then began developing the pain and swelling in the left knee.  He was seen by his PCP and diagnosed with gout and was also started on colchicine which he has been taking daily.  States that his symptoms have been waxing and waning but over the past 1 to 2 days worsened once again and because the weekend is coming up he wanted to come to the emergency department for evaluation.  States that he has an appointment for reevaluation with an orthopedist in 1 week.  States the pain worsens with palpation as well as when bearing weight.  Denies any redness, fevers, chills, nausea, vomiting.  PCP: Dr. Buckner Malta     Home Medications Prior to Admission medications   Medication Sig Start Date End Date Taking? Authorizing Provider  diclofenac Sodium (VOLTAREN) 1 % GEL Apply 2 g topically 4 (four) times daily. 04/22/22  Yes Rayna Sexton, PA-C  allopurinol (ZYLOPRIM) 300 MG tablet Take 300 mg by mouth at bedtime.     [provider]  Cyanocobalamin (B-12 COMPLIANCE INJECTION IJ) Inject 1 Dose as directed every 30 (thirty) days.    [provider]  labetalol (NORMODYNE) 200 MG tablet Take 200 mg by mouth 2 (two) times daily.    [provider]  Vitamin D, Ergocalciferol, (DRISDOL) 1.25 MG (50000 UNIT) CAPS capsule Take 50,000 Units by mouth every 7 (seven) days.    [provider]  warfarin (COUMADIN) 5 MG tablet  Take 5-7.5 mg by mouth See admin instructions. Alternate taking 5 mg one night and 7.5 mg the next then repeat    [provider]      Allergies    Moviprep [peg-kcl-nacl-nasulf-na asc-c], Demerol [meperidine], and Golytely [peg 3350-electrolytes]    Review of Systems   Review of Systems  Constitutional:  Negative for chills and fever.  Gastrointestinal:  Negative for nausea and vomiting.  Musculoskeletal:  Positive for arthralgias and joint swelling.  Skin:  Negative for color change.  Neurological:  Negative for weakness and numbness.   Physical Exam Updated Vital Signs BP 139/88 (BP Location: Right Arm)   Pulse 63   Temp 97.9 F (36.6 C) (Oral)   Resp 15   Ht '5\' 10"'$  (1.778 m)   Wt 108 kg   SpO2 94%   BMI 34.15 kg/m  Physical Exam Vitals and nursing note reviewed.  Constitutional:      General: He is not in acute distress.    Appearance: He is well-developed.  HENT:     Head: Normocephalic and atraumatic.     Right Ear: External ear normal.     Left Ear: External ear normal.  Eyes:     General: No scleral icterus.       Right eye: No discharge.        Left eye: No discharge.  Conjunctiva/sclera: Conjunctivae normal.  Neck:     Trachea: No tracheal deviation.  Cardiovascular:     Rate and Rhythm: Normal rate.  Pulmonary:     Effort: Pulmonary effort is normal. No respiratory distress.     Breath sounds: No stridor.  Abdominal:     General: There is no distension.  Musculoskeletal:        General: Swelling and tenderness present. No deformity.     Cervical back: Neck supple.     Comments: Left knee: Mild tenderness appreciated just superior to the patella.  Mild joint effusion noted.  No erythema or increased warmth appreciated in the joint.  Distal sensation intact.  Palpable pedal pulses.  Full range of motion of the left knee without pain.  Skin:    General: Skin is warm and dry.     Findings: No rash.  Neurological:     Mental Status: He is  alert.     Cranial Nerves: Cranial nerve deficit: no gross deficits.    ED Results / Procedures / Treatments   Labs (all labs ordered are listed, but only abnormal results are displayed) Labs Reviewed - No data to display  EKG None  Radiology DG Knee Complete 4 Views Left  Result Date: 04/22/2022 CLINICAL DATA:  Left knee pain and swelling for 2 days. History of gout. No reported injury. EXAM: LEFT KNEE - COMPLETE 4+ VIEW COMPARISON:  None Available. FINDINGS: No fracture or dislocation. Small to moderate suprapatellar left knee joint effusion. Small to moderate superior left patellar enthesophyte. Moderate tricompartmental left knee osteoarthritis, most prominent in the medial and patellofemoral compartments. No suspicious focal osseous lesions. No radiopaque foreign bodies. IMPRESSION: Small to moderate suprapatellar left knee joint effusion. Moderate tricompartmental left knee osteoarthritis, most prominent in the medial and patellofemoral compartments. No fracture or dislocation. Electronically Signed   By: Ilona Sorrel M.D.   On: 04/22/2022 16:14    Procedures Procedures   Medications Ordered in ED Medications - No data to display  ED Course/ Medical Decision Making/ A&P                           Medical Decision Making Amount and/or Complexity of Data Reviewed Radiology: ordered.   Pt is a 81 y.o. male who was recently diagnosed with an acute gout flare in the left knee and was started on colchicine.  States his symptoms have been waxing waning for the past week and came to the emergency department today for reevaluation before the weekend.  Imaging: X-ray of the left knee shows small to moderate suprapatellar left knee joint effusion.  Moderate tricompartmental left knee osteoarthritis, most prominent in the medial and patellofemoral compartments.  No fracture or dislocation.  I, Rayna Sexton, PA-C, personally reviewed and evaluated these images and lab results as part of  my medical decision-making.  On my exam patient has mild to moderate tenderness as well as a mild joint effusion noted in the suprapatellar region of the left knee.  No increased warmth or erythema.  Patient afebrile, nontachycardic, and nontoxic-appearing.  Denies any systemic symptoms such as fevers, chills, nausea, or vomiting.  Full active and passive range of motion of the left knee without pain.  Neurovascularly intact distal to the knee.  I obtained an x-ray with findings as noted above.  Appears consistent with patient's physical exam findings.  Agree with his PCP that this is likely a gout flare.  Does not appear consistent  with septic joint.  Recommended patient continue to take his prescribed colchicine.  Patient is anticoagulated and due to this as well as his age, do not feel that he is a good candidate for narcotic pain medications.  Will discharge with additional Voltaren gel.  Recommended the RICE method.  Patient appears stable for discharge at this time and he is agreeable.  Urged him to follow-up with his orthopedist at his scheduled appointment in 1 week.  We discussed return precautions in length.  His questions were answered and he was amicable at the time of discharge.  Note: Portions of this report may have been transcribed using voice recognition software. Every effort was made to ensure accuracy; however, inadvertent computerized transcription errors may be present.   Final Clinical Impression(s) / ED Diagnoses Final diagnoses:  Acute pain of left knee   Rx / DC Orders ED Discharge Orders          Ordered    diclofenac Sodium (VOLTAREN) 1 % GEL  4 times daily        04/22/22 1642              Rayna Sexton, PA-C 04/22/22 1648    Davonna Belling, MD 04/23/22 [phone]

## 2022-04-22 NOTE — ED Triage Notes (Signed)
Pt in c/o L leg pain after being treated for gout, pt seen x 1 wk ago with rx for colchicine, pt reevaluated yesterday and was referred to a bone doctor and pt states, " I have an appointment next Friday, but I hurt to bad and cannot wait that long." Pt requesting xrays of L knee, A&O x4

## 2022-04-22 NOTE — Discharge Instructions (Addendum)
Like we discussed, please continue to take your prescribed colchicine.  I am going to prescribe you additional Voltaren gel that you can apply to the knee for additional pain relief.  I would also recommend applying ice 1-2 times per day and elevating the leg at night.  Please follow-up with your orthopedic doctor at your scheduled visit in 1 week.  If you develop any new or worsening symptoms whatsoever please come back to the emergency department for reevaluation.

## 2023-01-01 IMAGING — DX DG KNEE COMPLETE 4+V*L*
5 series · 5 of 5 positions shown · non-contrast
Comparison: None Available.

CLINICAL DATA: Left knee pain and swelling for 2 days. History of
gout. No reported injury.

EXAM:
LEFT KNEE - COMPLETE 4+ VIEW

[knee ap]
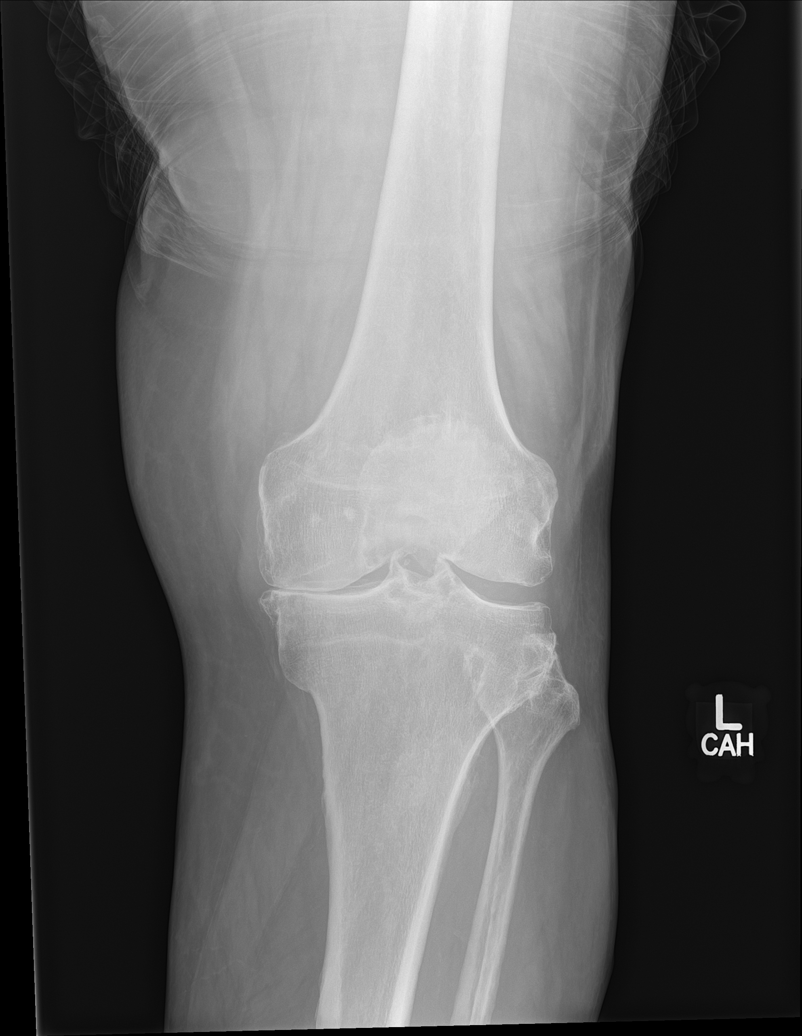

[knee obl (1 of 2)]
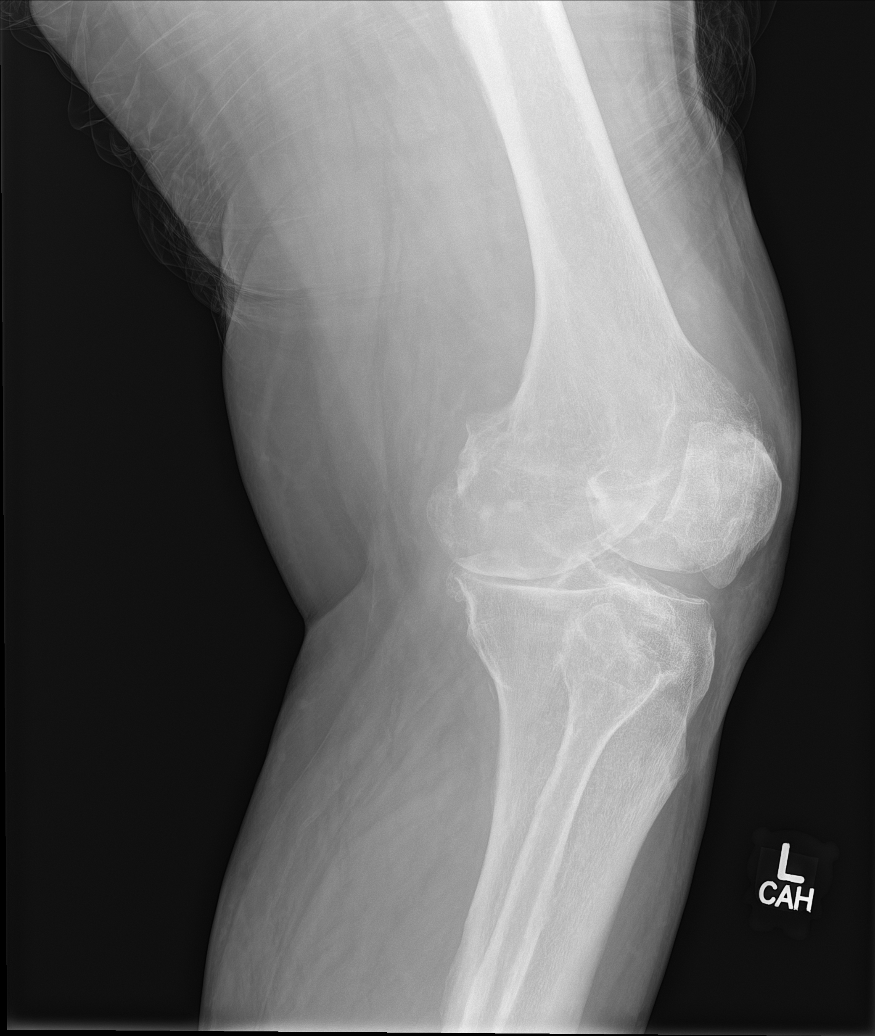

[knee obl (2 of 2)]
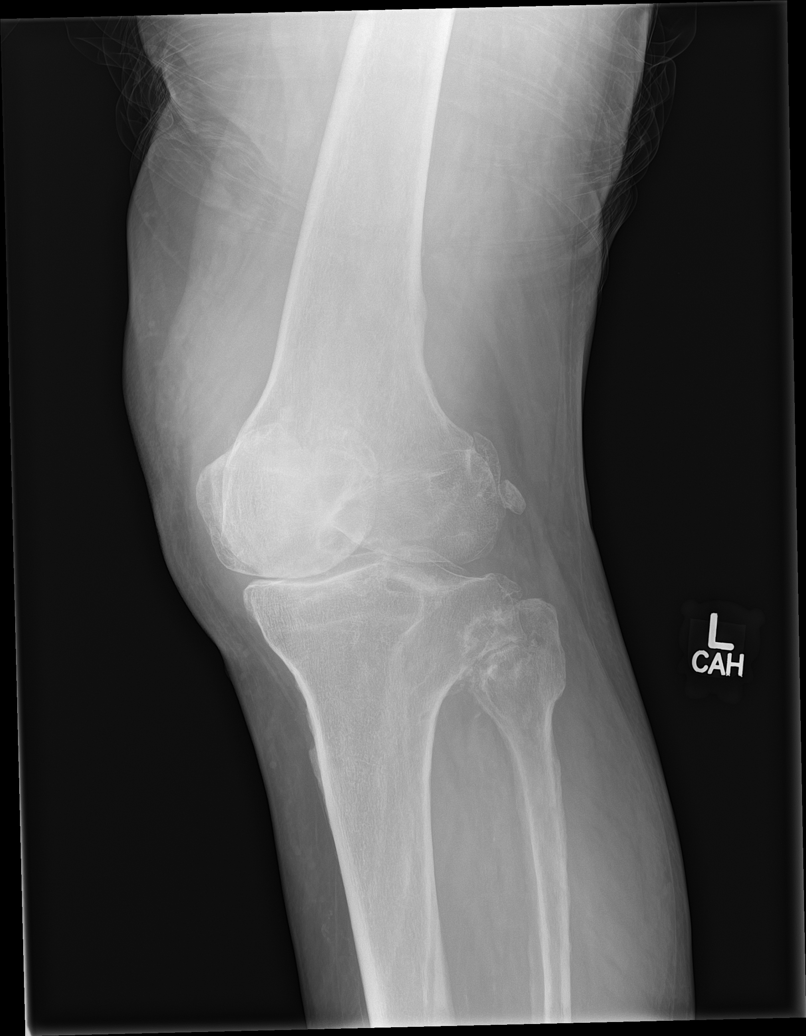

[knee lat (1 of 2)]
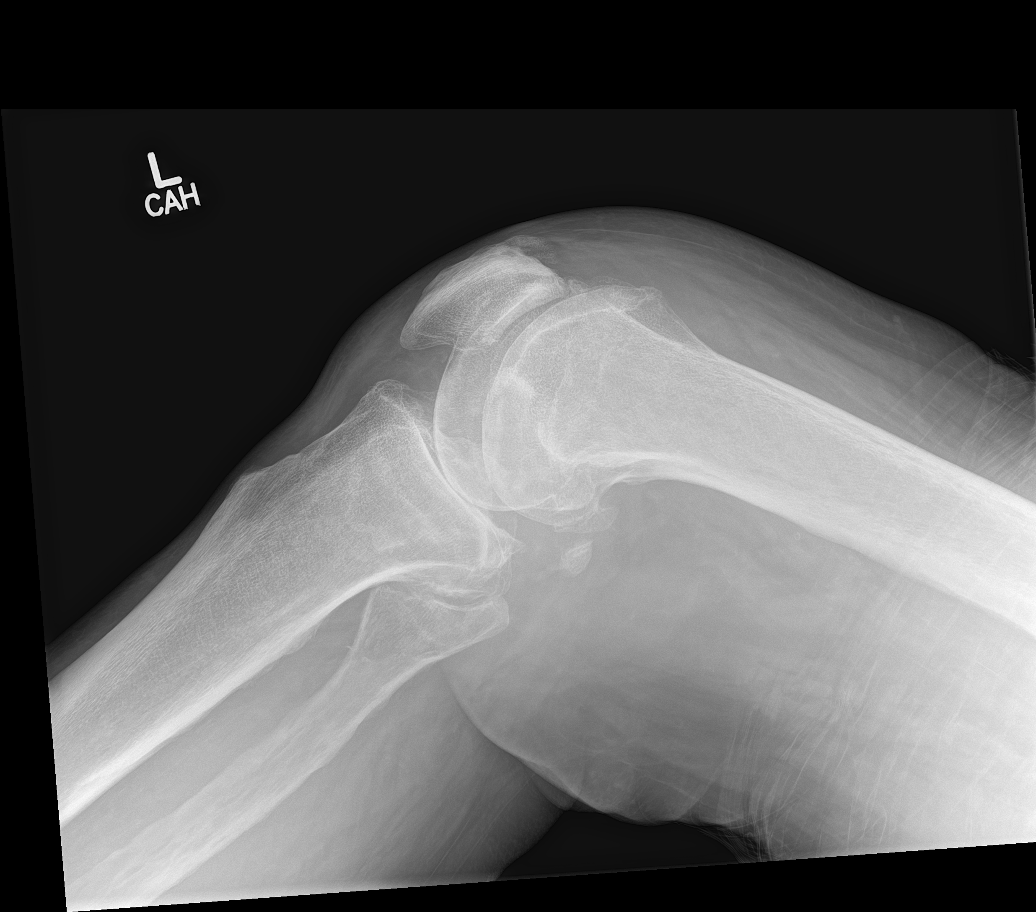

[knee lat (2 of 2)]
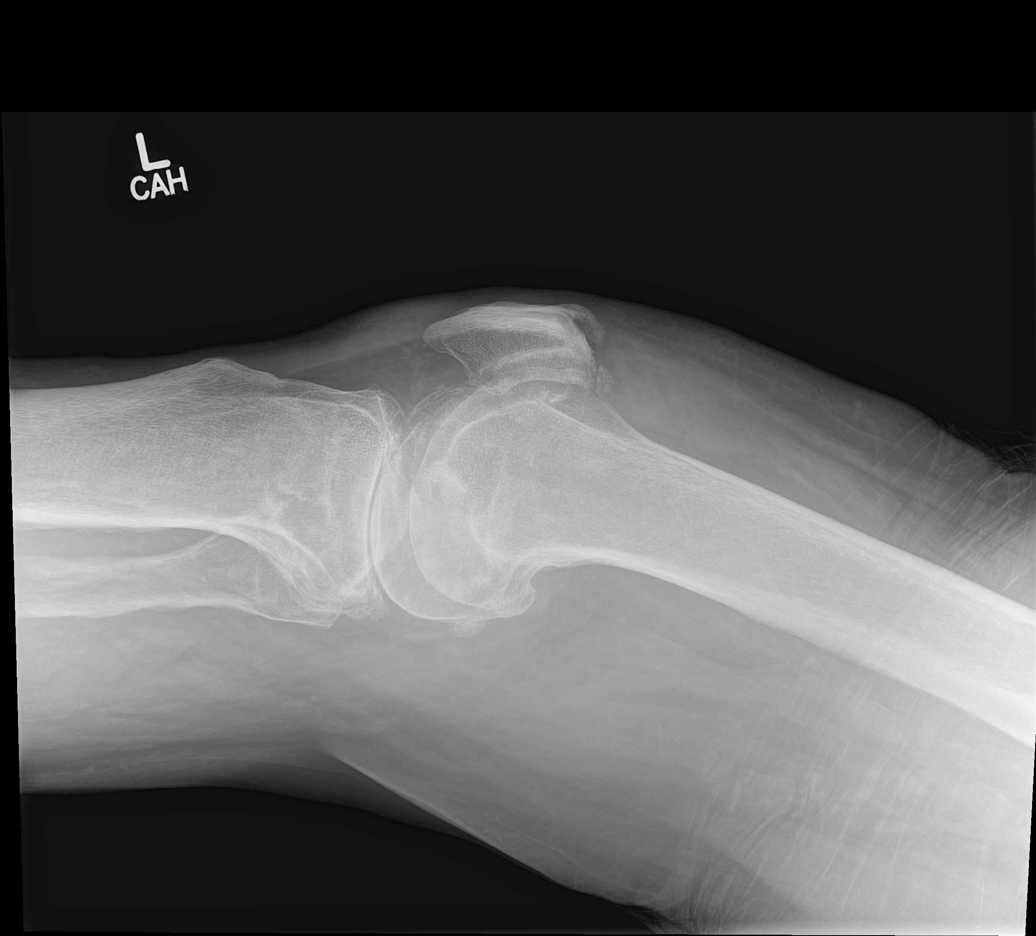

[5 of 5 positions shown; findings below may reference images not displayed]

FINDINGS: No fracture or dislocation. Small to moderate suprapatellar left
knee joint effusion. Small to moderate superior left patellar
enthesophyte. Moderate tricompartmental left knee osteoarthritis,
most prominent in the medial and patellofemoral compartments. No
suspicious focal osseous lesions. No radiopaque foreign bodies.
IMPRESSION: Small to moderate suprapatellar left knee joint effusion. Moderate
tricompartmental left knee osteoarthritis, most prominent in the
medial and patellofemoral compartments. No fracture or dislocation.

## 2023-01-20 DEATH — deceased
